# Patient Record
Sex: Female | Born: 1975 | Race: White | Hispanic: No | Marital: Married | Smoking: Current every day smoker
Health system: Southern US, Community
[De-identification: ages and names within clinical notes are randomized; demographics above are authoritative.]

## PROBLEM LIST (undated history)

## (undated) DIAGNOSIS — I10 Essential (primary) hypertension: Secondary | ICD-10-CM

## (undated) DIAGNOSIS — K219 Gastro-esophageal reflux disease without esophagitis: Secondary | ICD-10-CM

## (undated) DIAGNOSIS — Z720 Tobacco use: Secondary | ICD-10-CM

## (undated) DIAGNOSIS — F419 Anxiety disorder, unspecified: Secondary | ICD-10-CM

## (undated) DIAGNOSIS — N200 Calculus of kidney: Secondary | ICD-10-CM

## (undated) DIAGNOSIS — Z5320 Procedure and treatment not carried out because of patient's decision for unspecified reasons: Secondary | ICD-10-CM

## (undated) DIAGNOSIS — K760 Fatty (change of) liver, not elsewhere classified: Secondary | ICD-10-CM

## (undated) DIAGNOSIS — G43909 Migraine, unspecified, not intractable, without status migrainosus: Secondary | ICD-10-CM

## (undated) DIAGNOSIS — G8929 Other chronic pain: Secondary | ICD-10-CM

## (undated) HISTORY — DX: Fatty (change of) liver, not elsewhere classified: K76.0

## (undated) HISTORY — DX: Gastro-esophageal reflux disease without esophagitis: K21.9

## (undated) HISTORY — DX: Anxiety disorder, unspecified: F41.9

## (undated) HISTORY — DX: Essential (primary) hypertension: I10

## (undated) HISTORY — PX: ABLATION: SHX5711

## (undated) HISTORY — DX: Migraine, unspecified, not intractable, without status migrainosus: G43.909

## (undated) HISTORY — DX: Tobacco use: Z72.0

## (undated) HISTORY — DX: Procedure and treatment not carried out because of patient's decision for unspecified reasons: Z53.20

## (undated) HISTORY — DX: Other chronic pain: G89.29

## (undated) HISTORY — PX: TUBAL LIGATION: SHX77

## (undated) HISTORY — PX: CHOLECYSTECTOMY: SHX55

---

## 2010-09-23 ENCOUNTER — Emergency Department (HOSPITAL_COMMUNITY): Payer: PRIVATE HEALTH INSURANCE

## 2010-09-23 ENCOUNTER — Emergency Department (HOSPITAL_COMMUNITY)
Admission: EM | Admit: 2010-09-23 | Discharge: 2010-09-23 | Disposition: A | Discharge status: Home / Self Care | Payer: PRIVATE HEALTH INSURANCE | Attending: Emergency Medicine | Admitting: Emergency Medicine

## 2010-09-23 DIAGNOSIS — M533 Sacrococcygeal disorders, not elsewhere classified: Secondary | ICD-10-CM | POA: Insufficient documentation

## 2010-09-23 DIAGNOSIS — S0990XA Unspecified injury of head, initial encounter: Secondary | ICD-10-CM | POA: Insufficient documentation

## 2010-09-23 DIAGNOSIS — S59909A Unspecified injury of unspecified elbow, initial encounter: Secondary | ICD-10-CM | POA: Insufficient documentation

## 2010-09-23 DIAGNOSIS — S6990XA Unspecified injury of unspecified wrist, hand and finger(s), initial encounter: Secondary | ICD-10-CM | POA: Insufficient documentation

## 2010-09-23 DIAGNOSIS — M25579 Pain in unspecified ankle and joints of unspecified foot: Secondary | ICD-10-CM | POA: Insufficient documentation

## 2010-09-23 DIAGNOSIS — M25559 Pain in unspecified hip: Secondary | ICD-10-CM | POA: Insufficient documentation

## 2010-09-23 DIAGNOSIS — M542 Cervicalgia: Secondary | ICD-10-CM | POA: Insufficient documentation

## 2010-09-23 DIAGNOSIS — R079 Chest pain, unspecified: Secondary | ICD-10-CM | POA: Insufficient documentation

## 2010-09-23 DIAGNOSIS — S8990XA Unspecified injury of unspecified lower leg, initial encounter: Secondary | ICD-10-CM | POA: Insufficient documentation

## 2010-09-23 DIAGNOSIS — R51 Headache: Secondary | ICD-10-CM | POA: Insufficient documentation

## 2010-09-23 DIAGNOSIS — M25539 Pain in unspecified wrist: Secondary | ICD-10-CM | POA: Insufficient documentation

## 2010-09-23 DIAGNOSIS — Z79899 Other long term (current) drug therapy: Secondary | ICD-10-CM | POA: Insufficient documentation

## 2013-03-01 ENCOUNTER — Ambulatory Visit: Payer: Self-pay | Admitting: Physician Assistant

## 2013-03-01 VITALS — BP 152/120 | HR 104 | Temp 98.5°F | Resp 16 | Ht 65.5 in | Wt 167.2 lb

## 2013-03-01 DIAGNOSIS — F17200 Nicotine dependence, unspecified, uncomplicated: Secondary | ICD-10-CM

## 2013-03-01 DIAGNOSIS — I10 Essential (primary) hypertension: Secondary | ICD-10-CM

## 2013-03-01 LAB — POCT CBC
Granulocyte percent: 58.3 %G (ref 37–80)
HCT, POC: 49.8 % — AB (ref 37.7–47.9)
Hemoglobin: 15.6 g/dL (ref 12.2–16.2)
LYMPH, POC: 3.7 — AB (ref 0.6–3.4)
MCH: 31.2 pg (ref 27–31.2)
MCHC: 31.3 g/dL — AB (ref 31.8–35.4)
MCV: 99.7 fL — AB (ref 80–97)
MID (CBC): 0.5 (ref 0–0.9)
MPV: 10.8 fL (ref 0–99.8)
PLATELET COUNT, POC: 320 10*3/uL (ref 142–424)
POC Granulocyte: 6 (ref 2–6.9)
POC LYMPH %: 36.4 % (ref 10–50)
POC MID %: 5.3 % (ref 0–12)
RBC: 5 M/uL (ref 4.04–5.48)
RDW, POC: 13.3 %
WBC: 10.3 10*3/uL — AB (ref 4.6–10.2)

## 2013-03-01 LAB — COMPREHENSIVE METABOLIC PANEL
ALT: 26 U/L (ref 0–35)
AST: 24 U/L (ref 0–37)
Albumin: 4.9 g/dL (ref 3.5–5.2)
Alkaline Phosphatase: 75 U/L (ref 39–117)
BILIRUBIN TOTAL: 0.4 mg/dL (ref 0.2–1.2)
BUN: 14 mg/dL (ref 6–23)
CALCIUM: 9.9 mg/dL (ref 8.4–10.5)
CHLORIDE: 103 meq/L (ref 96–112)
CO2: 24 mEq/L (ref 19–32)
CREATININE: 0.62 mg/dL (ref 0.50–1.10)
Glucose, Bld: 80 mg/dL (ref 70–99)
Potassium: 4.1 mEq/L (ref 3.5–5.3)
Sodium: 135 mEq/L (ref 135–145)
Total Protein: 7.6 g/dL (ref 6.0–8.3)

## 2013-03-01 LAB — TSH: TSH: 3.686 u[IU]/mL (ref 0.350–4.500)

## 2013-03-01 MED ORDER — LISINOPRIL-HYDROCHLOROTHIAZIDE 10-12.5 MG PO TABS: 1.0000 | ORAL_TABLET | Freq: Every day | ORAL | Status: DC

## 2013-03-01 NOTE — Progress Notes (Signed)
Subjective:    Patient ID: Dominique Stevens, female    DOB: 10/12/1975, 38 y.o.   MRN: [id number]  HPI Primary Physician: No primary provider on file.  Chief Complaint: Elevated blood pressure  HPI: 38 y.o. female with history of anxiety presents for evaluation of elevated blood pressure. Patient planning to have oral surgery by Dr. Alfred Levins at a future date to be determined. Her BP was 204/174 at her pre-op OV on 02/28/13. She checked her BP at [facility] later that same day as well and the reading was still in the 180's/120's. She has never been diagnosed with hypertension nor has she ever taken any antihypertensives. She does note some increases in her headaches, but denies any chest pain or vision changes.   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 She has two teenage boys. She is constantly on the move taking care of her husband and two sons. She has let her health slide secondary to taking care of the above. She does not take time out for herself. She eats mostly fried or fast foods. No healthy foods. She does not exercise. Current everyday smoker.   She does have a history of anxiety and when she starts to feel some panic her chest will tighten. She denies any chest pain.    Past Medical History  Diagnosis Date  . Anxiety      Home Meds: Prior to Admission medications   Medication Sig Start Date End Date Taking? Authorizing Provider  HYDROcodone-acetaminophen (NORCO/VICODIN) 5-325 MG per tablet Take 1 tablet by mouth every 6 (six) hours as needed for moderate pain.   Yes Historical Provider, MD  penicillin v potassium (VEETID) 500 MG tablet Take 500 mg by mouth 3 (three) times daily.   Yes Historical Provider, MD    Allergies: No Known Allergies  History   Social History  . Marital Status: Married    Spouse Name: N/A    Number of Children: N/A  . Years of Education: N/A   Occupational History  . Not on file.   Social  History Main Topics  . Smoking status: Current Every Day Smoker -- 0.05 packs/day for 20 years    Types: Cigarettes  . Smokeless tobacco: Not on file  . Alcohol Use: No  . Drug Use: No  . Sexual Activity: Not on file   Other Topics Concern  . Not on file   Social History Narrative  . No narrative on file     Review of Systems  Constitutional: Positive for unexpected weight change.       Gain of 20 pounds over the past several years.   Eyes: Negative for visual disturbance.  Respiratory: Positive for chest tightness.   Cardiovascular: Negative for chest pain.  Gastrointestinal: Negative for diarrhea.  Endocrine: Positive for heat intolerance.  Neurological: Positive for headaches.  Psychiatric/Behavioral: Negative for suicidal ideas, hallucinations, behavioral problems, confusion, sleep disturbance, self-injury, dysphoric mood, decreased concentration and agitation. The patient is nervous/anxious. The patient is not hyperactive.        Objective:   Physical Exam  Physical Exam: Blood pressure 152/120, pulse 104, temperature 98.5 F (36.9 C), temperature source Oral, resp. rate 16, height 5' 5.5" (1.664 m), weight 167 lb 3.2 oz (75.841 kg), SpO2 98.00%., Body mass index is 27.39 kg/(m^2). General: Well developed, well nourished, in no acute distress. Head: Normocephalic, atraumatic, eyes without discharge, sclera non-icteric, nares are without discharge. Bilateral auditory  canals clear, TM's are without perforation, pearly grey and translucent with reflective cone of light bilaterally. Oral cavity moist, posterior pharynx without exudate, erythema, peritonsillar abscess, or post nasal drip. Uvula midline.   Neck: Supple. No thyromegaly. Full ROM. No lymphadenopathy. Lungs: Clear bilaterally to auscultation without wheezes, rales, or rhonchi. Breathing is unlabored. Heart: RRR with S1 S2. No murmurs, rubs, or gallops appreciated. Msk:  Strength and tone normal for  age. Extremities/Skin: Warm and dry. No clubbing or cyanosis. No edema. No rashes or suspicious lesions. Neuro: Alert and oriented X 3. Moves all extremities spontaneously. Gait is normal. CNII-XII grossly in tact. Psych:  Responds to questions appropriately with a normal affect.   EKG: NSR, no LVH  Labs: Results for orders placed in visit on 03/01/13  POCT CBC      Result Value Ref Range   WBC 10.3 (*) 4.6 - 10.2 K/uL   Lymph, poc 3.7 (*) 0.6 - 3.4   POC LYMPH PERCENT 36.4  10 - 50 %L   MID (cbc) 0.5  0 - 0.9   POC MID % 5.3  0 - 12 %M   POC Granulocyte 6.0  2 - 6.9   Granulocyte percent 58.3  37 - 80 %G   RBC 5.00  4.04 - 5.48 M/uL   Hemoglobin 15.6  12.2 - 16.2 g/dL   HCT, POC 49.8 (*) 37.7 - 47.9 %   MCV 99.7 (*) 80 - 97 fL   MCH, POC 31.2  27 - 31.2 pg   MCHC 31.3 (*) 31.8 - 35.4 g/dL   RDW, POC 13.3     Platelet Count, POC 320  142 - 424 K/uL   MPV 10.8  0 - 99.8 fL    CMP and TSH pending     Assessment & Plan:  38 year old female with uncontrolled hypertension and tobacco use disorder  -Start lisinopril/HCTZ 10/12.5 mg 1 po daily #90 RF 0 -Healthy diet and exercise  -Weight loss -Stop smoking tobacco -Take time out for yourself -Recheck 2-3 weeks -Fax clearance to Dr. Alfred Levins at [phone] once she has demonstrated control    Christell Faith, MHS, PA-C [facility] [place], [place] [address] [facility] 03/01/2013 11:32 AM

## 2013-03-12 ENCOUNTER — Telehealth: Payer: Self-pay

## 2013-03-12 ENCOUNTER — Ambulatory Visit: Payer: Self-pay | Admitting: Physician Assistant

## 2013-03-12 VITALS — BP 132/82 | HR 102 | Temp 98.2°F | Resp 17 | Ht 65.5 in | Wt 158.0 lb

## 2013-03-12 DIAGNOSIS — R112 Nausea with vomiting, unspecified: Secondary | ICD-10-CM

## 2013-03-12 DIAGNOSIS — K0889 Other specified disorders of teeth and supporting structures: Secondary | ICD-10-CM

## 2013-03-12 DIAGNOSIS — K089 Disorder of teeth and supporting structures, unspecified: Secondary | ICD-10-CM

## 2013-03-12 DIAGNOSIS — I10 Essential (primary) hypertension: Secondary | ICD-10-CM

## 2013-03-12 MED ORDER — PROMETHAZINE HCL 12.5 MG PO TABS: 12.5000 mg | ORAL_TABLET | Freq: Three times a day (TID) | ORAL | Status: DC | PRN

## 2013-03-12 MED ORDER — LISINOPRIL-HYDROCHLOROTHIAZIDE 10-12.5 MG PO TABS: 1.0000 | ORAL_TABLET | Freq: Every day | ORAL | Status: DC

## 2013-03-12 MED ORDER — PROMETHAZINE HCL 25 MG/ML IJ SOLN
25.0000 mg | Freq: Once | INTRAMUSCULAR | Status: AC
  Administered 2013-03-12: 25 mg via INTRAMUSCULAR

## 2013-03-12 NOTE — Telephone Encounter (Signed)
PT STATES THAT SHE WILL BE COMING IN TODAY!

## 2013-03-12 NOTE — Telephone Encounter (Signed)
Patient called to ask if someone clinical or a nurse could call her back today (I told her we would try at least to respond back within 24 hours). Patient see's Thurmond Butts and was last seen for her blood pressure and the medication she was given by him for that appeared to be working fine. She now reports vomiting and illness; she states her blood pressure was normal before at 116/82 now it has suddenly shot to 160/98 for 3 or 4 days (she says she has an at home machine to read her blood pressure). Patient is alarmed and wants to know if its because of the medication or something else. Please advise. She was advised that she might need to be seen for these new symptoms. She still requested to speak to someone clinical and she understood if she may need to come back in. She is aware of Ryan's hours.   [phone]

## 2013-03-12 NOTE — Progress Notes (Signed)
Subjective:    Patient ID: Dominique Stevens, female    DOB: [birth year], 38 y.o.   MRN: [id number]  HPI Primary Physician: No PCP Per Patient  Chief Complaint: Follow up hypertension and nausea/vomiting  HPI: 38 y.o. female with history below presents with 2 issues.  1) Hypertension: Currently on lisinopril/HCTZ 10/12.5 mg daily. Notes that her blood pressures at home are much improved from initial presentation of 152/120. Home blood pressures have been running in the 1-teens to 120's over 70's to 80's. She is tolerating the medication without any adverse effects. No chest pain, headaches, vision changes, or focal deficits. She would like to go ahead and schedule her dental procedure.   2) Nausea/vomiting: 2 day history of nausea and vomiting. She was recently changed from penicillin to amoxicillin and her hydrocodone was increased from 5 mg to 10 mg. Since these changes when she takes her medications she develops nausea and sometimes vomits. After she vomits she feels better. Outside of taking her medications she feels fine. She states it is completely related to taking these medications. She knows that she does not tolerate pain medication in general. No diarrhea. Afebrile. No chills. Decreased appetite, but more so because her teeth hurt to chew food right now. She would like to go ahead and have her dental procedure done this week.     Past Medical History  Diagnosis Date  . Anxiety   . Hypertension      Home Meds: Prior to Admission medications   Medication Sig Start Date End Date Taking? Authorizing Provider  amoxicillin (AMOXIL) 500 MG capsule Take 500 mg by mouth 3 (three) times daily.   Yes Historical Provider, MD  HYDROcodone-acetaminophen (NORCO/VICODIN) 5-325 MG per tablet Take 1 tablet by mouth every 6 (six) hours as needed for moderate pain.   Yes Historical Provider, MD  lisinopril-hydrochlorothiazide (PRINZIDE,ZESTORETIC) 10-12.5 MG per tablet Take 1 tablet by mouth daily.  03/01/13  Yes Redell Nazir Lyn Hollingshead, PA-C    Allergies: No Known Allergies  History   Social History  . Marital Status: Married    Spouse Name: N/A    Number of Children: N/A  . Years of Education: N/A   Occupational History  . Not on file.   Social History Main Topics  . Smoking status: Current Every Day Smoker -- 0.50 packs/day for 20 years    Types: Cigarettes  . Smokeless tobacco: Not on file  . Alcohol Use: No  . Drug Use: No  . Sexual Activity: Not on file   Other Topics Concern  . Not on file   Social History Narrative  . No narrative on file     Review of Systems  Constitutional: Positive for appetite change. Negative for fever, chills and fatigue.       Pushing fluids.   HENT: Positive for dental problem.   Eyes: Negative for visual disturbance.  Cardiovascular: Negative for chest pain.  Gastrointestinal: Positive for nausea, vomiting and constipation. Negative for abdominal pain, diarrhea and abdominal distention.  Neurological: Negative for headaches.       Objective:   Physical Exam  Physical Exam: Blood pressure 132/82, pulse 102, temperature 98.2 F (36.8 C), temperature source Oral, resp. rate 17, height 5' 5.5" (1.664 m), weight 158 lb (71.668 kg), SpO2 96.00%., Body mass index is 25.88 kg/(m^2). General: Well developed, well nourished, in no acute distress. Head: Normocephalic, atraumatic, eyes without discharge, sclera non-icteric, nares are without discharge. Bilateral auditory canals clear, TM's are without perforation, pearly grey  and translucent with reflective cone of light bilaterally. Oral cavity moist, posterior pharynx without exudate, erythema, peritonsillar abscess, or post nasal drip. Uvula midline.   Neck: Supple. No thyromegaly. Full ROM. No lymphadenopathy. Lungs: Clear bilaterally to auscultation without wheezes, rales, or rhonchi. Breathing is unlabored. Heart: RRR with S1 S2. No murmurs, rubs, or gallops appreciated. Abdomen: Soft,  non-tender, non-distended with normoactive bowel sounds. No hepatosplenomegaly. No rebound/guarding. No obvious abdominal masses. Msk:  Strength and tone normal for age. Extremities/Skin: Warm and dry. No clubbing or cyanosis. No edema. No rashes or suspicious lesions. Neuro: Alert and oriented X 3. Moves all extremities spontaneously. Gait is normal. CNII-XII grossly in tact. Psych:  Responds to questions appropriately with a normal affect.        Assessment & Plan:  38 year old female with hypertension, nausea, vomiting, and poor dentition/dental pain  1) Hypertension -Much improved -Continue lisinopril/HCTZ 10/12.5 mg daily #90 no RF  2) Nausea and vomiting -Based on her history this is completely dependent on her medications for her teeth. It did nto start until she was changed to amoxicillin and when her hydrocodone was increased from 5 mg to 10 mg. She does not develop nausea until she takes these medications. Based on this history and with trying to be financially conscious for her I have decided to with hold labs at this time. If her symptoms persist plan for further evaluation.  -Phenergan 25 mg IM, patient's mother is here in the exam room. Here mother will be driving home -Phenergan 12.5 mg 1 po q 8 hours prn nausea #20 RF 2 -She to tolerating PO fluids. If she is unable to rehydrate through oral rehydration she will require IV rehydration -Discussed common OTC remedies for constipation secondary to long term narcotic usage   3) Poor dentition/dental pain -Cleared for dental work at this time -Follow up with Dr. Alfred Levins   Christell Faith, MHS, PA-C [facility] [address] [place], [place] [address] [facility] 03/12/2013 1:00 PM

## 2013-03-20 ENCOUNTER — Ambulatory Visit: Payer: Self-pay | Admitting: Physician Assistant

## 2013-08-23 ENCOUNTER — Telehealth: Payer: Self-pay

## 2013-08-23 DIAGNOSIS — I10 Essential (primary) hypertension: Secondary | ICD-10-CM

## 2013-08-23 MED ORDER — LISINOPRIL-HYDROCHLOROTHIAZIDE 10-12.5 MG PO TABS: 1.0000 | ORAL_TABLET | Freq: Every day | ORAL | Status: DC

## 2013-08-23 NOTE — Telephone Encounter (Signed)
Patient requesting refill on "lisinopril 10-12.5mg " please call in to [facility] in [place]. Patients call back number [phone]. Patient states she only has 3 pills left.

## 2013-08-23 NOTE — Addendum Note (Signed)
Addended by: Jethro Bolus A on: 08/23/2013 04:25 PM   Modules accepted: Orders

## 2013-08-23 NOTE — Telephone Encounter (Signed)
Pt needs OV/Labs. Sent in 30 day supply. She does not have health insurance right now. She has recently started a new job and has to wait 90 days for her insurance to take effect. She also has a form that needs to be completed. She states this is to keep her new job. It is a 4 question form in regards to medication she is taking preventing her from taking care of children. Advised pt to email form to me and I would see if one of the PA's would be able to sign it.

## 2013-08-25 NOTE — Telephone Encounter (Signed)
Pt advised form has been completed.  In pick up drawer and copy sent to scan.

## 2013-08-25 NOTE — Telephone Encounter (Signed)
Form completed based on Mr. Dominique Stevens encounter of 03/01/2013.

## 2013-10-04 ENCOUNTER — Telehealth: Payer: Self-pay

## 2013-10-04 DIAGNOSIS — I10 Essential (primary) hypertension: Secondary | ICD-10-CM

## 2013-10-04 MED ORDER — LISINOPRIL-HYDROCHLOROTHIAZIDE 10-12.5 MG PO TABS: 1.0000 | ORAL_TABLET | Freq: Every day | ORAL | Status: DC

## 2013-10-04 NOTE — Telephone Encounter (Signed)
Pt is still waiting for insurance to take effect. Pt will be able to come in sometime in November.  Sent in another #30

## 2013-10-04 NOTE — Telephone Encounter (Signed)
Pt says she has contacted her pharmacy for three days now and that they have tried to contact us to request a refill on her BP medicine.  I did not see anything in the computer for this.  She has one pill left.  Please call asap. [phone]

## 2017-10-17 ENCOUNTER — Emergency Department (HOSPITAL_COMMUNITY): Payer: Self-pay

## 2017-10-17 ENCOUNTER — Other Ambulatory Visit: Payer: Self-pay

## 2017-10-17 ENCOUNTER — Emergency Department (HOSPITAL_COMMUNITY)
Admission: EM | Admit: 2017-10-17 | Discharge: 2017-10-17 | Disposition: A | Discharge status: Home / Self Care | Payer: Self-pay | Attending: Emergency Medicine | Admitting: Emergency Medicine

## 2017-10-17 ENCOUNTER — Encounter (HOSPITAL_COMMUNITY): Payer: Self-pay | Admitting: Emergency Medicine

## 2017-10-17 DIAGNOSIS — Z590 Homelessness: Secondary | ICD-10-CM | POA: Insufficient documentation

## 2017-10-17 DIAGNOSIS — Z79899 Other long term (current) drug therapy: Secondary | ICD-10-CM | POA: Insufficient documentation

## 2017-10-17 DIAGNOSIS — I10 Essential (primary) hypertension: Secondary | ICD-10-CM | POA: Insufficient documentation

## 2017-10-17 DIAGNOSIS — R51 Headache: Secondary | ICD-10-CM | POA: Insufficient documentation

## 2017-10-17 DIAGNOSIS — R519 Headache, unspecified: Secondary | ICD-10-CM

## 2017-10-17 DIAGNOSIS — F17210 Nicotine dependence, cigarettes, uncomplicated: Secondary | ICD-10-CM | POA: Insufficient documentation

## 2017-10-17 LAB — CBC
HCT: 45.3 % (ref 36.0–46.0)
Hemoglobin: 14.3 g/dL (ref 12.0–15.0)
MCH: 29.7 pg (ref 26.0–34.0)
MCHC: 31.6 g/dL (ref 30.0–36.0)
MCV: 94.2 fL (ref 80.0–100.0)
PLATELETS: 444 10*3/uL — AB (ref 150–400)
RBC: 4.81 MIL/uL (ref 3.87–5.11)
RDW: 13.2 % (ref 11.5–15.5)
WBC: 11.6 10*3/uL — ABNORMAL HIGH (ref 4.0–10.5)
nRBC: 0 % (ref 0.0–0.2)

## 2017-10-17 LAB — URINALYSIS, ROUTINE W REFLEX MICROSCOPIC
Bilirubin Urine: NEGATIVE
GLUCOSE, UA: NEGATIVE mg/dL
Hgb urine dipstick: NEGATIVE
Ketones, ur: 20 mg/dL — AB
Leukocytes, UA: NEGATIVE
Nitrite: NEGATIVE
PH: 8 (ref 5.0–8.0)
Protein, ur: 30 mg/dL — AB
SPECIFIC GRAVITY, URINE: 1.023 (ref 1.005–1.030)

## 2017-10-17 LAB — COMPREHENSIVE METABOLIC PANEL
ALBUMIN: 4.5 g/dL (ref 3.5–5.0)
ALK PHOS: 87 U/L (ref 38–126)
ALT: 49 U/L — ABNORMAL HIGH (ref 0–44)
ANION GAP: 8 (ref 5–15)
AST: 43 U/L — ABNORMAL HIGH (ref 15–41)
BILIRUBIN TOTAL: 0.9 mg/dL (ref 0.3–1.2)
BUN: 11 mg/dL (ref 6–20)
CALCIUM: 9.7 mg/dL (ref 8.9–10.3)
CO2: 27 mmol/L (ref 22–32)
CREATININE: 0.69 mg/dL (ref 0.44–1.00)
Chloride: 102 mmol/L (ref 98–111)
GFR calc Af Amer: >60 mL/min (ref >60)
GFR calc non Af Amer: >60 mL/min (ref >60)
GLUCOSE: 101 mg/dL — AB (ref 70–99)
Potassium: 3.9 mmol/L (ref 3.5–5.1)
Sodium: 137 mmol/L (ref 135–145)
TOTAL PROTEIN: 7.7 g/dL (ref 6.5–8.1)

## 2017-10-17 LAB — LIPASE, BLOOD: Lipase: 26 U/L (ref 11–51)

## 2017-10-17 MED ORDER — SODIUM CHLORIDE 0.9 % IV SOLN
1000.0000 mL | INTRAVENOUS | Status: DC
  Administered 2017-10-17: 1000 mL via INTRAVENOUS

## 2017-10-17 MED ORDER — MORPHINE SULFATE (PF) 4 MG/ML IV SOLN
4.0000 mg | Freq: Once | INTRAVENOUS | Status: AC
  Administered 2017-10-17: 4 mg via INTRAVENOUS
  Filled 2017-10-17: qty 1

## 2017-10-17 MED ORDER — SODIUM CHLORIDE 0.9 % IV BOLUS (SEPSIS)
1000.0000 mL | Freq: Once | INTRAVENOUS | Status: AC
  Administered 2017-10-17: 1000 mL via INTRAVENOUS

## 2017-10-17 MED ORDER — KETOROLAC TROMETHAMINE 30 MG/ML IJ SOLN
30.0000 mg | Freq: Once | INTRAMUSCULAR | Status: AC
  Administered 2017-10-17: 30 mg via INTRAVENOUS
  Filled 2017-10-17: qty 1

## 2017-10-17 MED ORDER — DIPHENHYDRAMINE HCL 50 MG/ML IJ SOLN
25.0000 mg | Freq: Once | INTRAMUSCULAR | Status: AC
  Administered 2017-10-17: 25 mg via INTRAVENOUS
  Filled 2017-10-17: qty 1

## 2017-10-17 MED ORDER — METOCLOPRAMIDE HCL 5 MG/ML IJ SOLN
10.0000 mg | Freq: Once | INTRAMUSCULAR | Status: AC
  Administered 2017-10-17: 10 mg via INTRAVENOUS
  Filled 2017-10-17: qty 2

## 2017-10-17 MED ORDER — ONDANSETRON HCL 4 MG/2ML IJ SOLN
4.0000 mg | Freq: Once | INTRAMUSCULAR | Status: AC
  Administered 2017-10-17: 4 mg via INTRAVENOUS
  Filled 2017-10-17: qty 2

## 2017-10-17 MED ORDER — PROCHLORPERAZINE MALEATE 10 MG PO TABS: 10.0000 mg | ORAL_TABLET | Freq: Two times a day (BID) | ORAL | 0 refills | Status: DC | PRN

## 2017-10-17 NOTE — Discharge Instructions (Signed)
Your work-up today was overall reassuring.  We did see evidence of chronic microvascular changes likely due to hypertension on your MRI.  Please follow-up with a primary care physician for further monitoring of this.  Please maintain your hydration.  Please use the nausea medication to help with nausea and headache and to drink fluids.  If any symptoms change or worsen, please return to the nearest emergency department.

## 2017-10-17 NOTE — ED Notes (Signed)
Pt returned from MRI °

## 2017-10-17 NOTE — ED Triage Notes (Signed)
Pt. Stated, I started having a headache and not being able to keep anything down afer supper, I also having some chest pain. And have los 20 lbs.

## 2017-10-17 NOTE — ED Notes (Signed)
Per MRI, pt has 1 patient in front of her. Pt notified.

## 2017-10-17 NOTE — ED Provider Notes (Addendum)
[facility] EMERGENCY DEPARTMENT Provider Note   CSN: [id number] Arrival date & time: 10/17/17  1134     History   Chief Complaint Chief Complaint  Patient presents with  . Chest Pain  . Headache  . Nausea    HPI Dominique Stevens is a 42 y.o. female.  HPI Patient reports that she started having a headache yesterday.  It started on the right side behind her eye and forehead.  She reports it was pretty bad but she continued her normal activities.  She reports after dinner she started vomiting and she has not stop vomiting since.  Reports that she still has a lot of pain on the right side of her forehead.  She denies she has history of migraines.  No visual changes.  No weakness numbness or tingling of extremities.  No gait incoordination.  No dizziness.  No fever, no neck stiffness.  She reports that she started getting chest pressure today but she thinks is because she is getting very anxious.  Patient reports that she does have a lot of life issues that contribute to anxiety.  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  She quit her job within the past month. Past Medical History:  Diagnosis Date  . Anxiety   . Hypertension     There are no active problems to display for this patient.   Past Surgical History:  Procedure Laterality Date  . ABLATION    . CHOLECYSTECTOMY    . TUBAL LIGATION       OB History   None      Home Medications    Prior to Admission medications   Medication Sig Start Date End Date Taking? Authorizing Provider  omeprazole (PRILOSEC) 40 MG capsule Take 40 mg by mouth daily.   Yes [provider]  lisinopril-hydrochlorothiazide (PRINZIDE,ZESTORETIC) 10-12.5 MG per tablet Take 1 tablet by mouth daily. Patient not taking: Reported on 10/17/2017 10/04/13   Harrison Mons, PA  promethazine (PHENERGAN) 12.5 MG tablet Take 1 tablet (12.5 mg total) by mouth every 8 (eight) hours as needed for nausea. Patient not taking: Reported on  10/17/2017 03/12/13   Rise Mu, PA-C    Family History Family History  Problem Relation Age of Onset  . Hypertension Mother   . Hypertension Maternal Grandmother   . Heart disease Maternal Grandfather     Social History Social History   Tobacco Use  . Smoking status: Current Every Day Smoker    Packs/day: 0.50    Years: 20.00    Pack years: 10.00    Types: Cigarettes  . Smokeless tobacco: Current User  Substance Use Topics  . Alcohol use: Yes  . Drug use: No     Allergies   Patient has no known allergies.   Review of Systems Review of Systems 10 Systems reviewed and are negative for acute change except as noted in the HPI.  Physical Exam Updated Vital Signs BP 133/83   Pulse 78   Resp 10   Ht 5\' 6"  (1.676 m)   SpO2 99%   BMI 25.50 kg/m   Physical Exam  Constitutional: She is oriented to person, place, and time.  Patient is alert and nontoxic.  Mental status is clear.  She is very anxious in appearance.  She is tearful and hyperventilating slightly.  HENT:  Head: Normocephalic and atraumatic.  Nose: Nose normal.  Mouth/Throat: Oropharynx is clear and moist.  Eyes: Pupils are equal, round, and reactive to light. EOM  are normal.  Neck: Neck supple.  Cardiovascular: Normal rate, regular rhythm, normal heart sounds and intact distal pulses.  Pulmonary/Chest: Effort normal and breath sounds normal.  Abdominal: Soft. She exhibits no distension. There is no tenderness. There is no guarding.  Musculoskeletal: Normal range of motion. She exhibits no edema or tenderness.  Neurological: She is alert and oriented to person, place, and time. No cranial nerve deficit. She exhibits normal muscle tone. Coordination normal.  Skin: Skin is warm and dry.  Psychiatric:  Patient is very anxious and intermittently slightly tearful.     ED Treatments / Results  Labs (all labs ordered are listed, but only abnormal results are displayed) Labs Reviewed  COMPREHENSIVE  METABOLIC PANEL - Abnormal; Notable for the following components:      Result Value   Glucose, Bld 101 (*)    AST 43 (*)    ALT 49 (*)    All other components within normal limits  CBC - Abnormal; Notable for the following components:   WBC 11.6 (*)    Platelets 444 (*)    All other components within normal limits  URINALYSIS, ROUTINE W REFLEX MICROSCOPIC - Abnormal; Notable for the following components:   APPearance HAZY (*)    Ketones, ur 20 (*)    Protein, ur 30 (*)    Bacteria, UA RARE (*)    All other components within normal limits  LIPASE, BLOOD    EKG EKG Interpretation  Date/Time:  Sunday October 17 2017 11:39:36 EDT Ventricular Rate:  102 PR Interval:  126 QRS Duration: 72 QT Interval:  340 QTC Calculation: 443 R Axis:   78 Text Interpretation:  Sinus tachycardia Biatrial enlargement Cannot rule out Anterior infarct , age undetermined Abnormal ECG agree. no old comparison Confirmed by Charlesetta Shanks [phone]) on 10/17/2017 1:09:35 PM   Radiology Dg Chest 2 View  Result Date: 10/17/2017 CLINICAL DATA:  Chest pain EXAM: CHEST - 2 VIEW COMPARISON:  01/21/2015 FINDINGS: The heart size and mediastinal contours are within normal limits. Both lungs are clear. The visualized skeletal structures are unremarkable. IMPRESSION: No active cardiopulmonary disease. Electronically Signed   By: Inez Catalina M.D.   On: 10/17/2017 13:19   Ct Head Wo Contrast  Result Date: 10/17/2017 CLINICAL DATA:  Patient with severe headache. EXAM: CT HEAD WITHOUT CONTRAST TECHNIQUE: Contiguous axial images were obtained from the base of the skull through the vertex without intravenous contrast. COMPARISON:  None. FINDINGS: Brain: Ventricles and sulci are appropriate for patient's age. No evidence for acute cortically based infarct, intracranial hemorrhage, mass lesion or mass-effect. Vascular: Unremarkable. Skull: Intact. Sinuses/Orbits: Air-fluid level within the left maxillary sinus. Remainder the  paranasal sinuses well aerated. Mastoid air cells unremarkable. Orbits unremarkable. Other: None. IMPRESSION: No acute intracranial process. Electronically Signed   By: Lovey Newcomer M.D.   On: 10/17/2017 14:26    Procedures Procedures (including critical care time)  Medications Ordered in ED Medications  sodium chloride 0.9 % bolus 1,000 mL (0 mLs Intravenous Stopped 10/17/17 1358)    Followed by  0.9 %  sodium chloride infusion (1,000 mLs Intravenous New Bag/Given 10/17/17 1330)  ondansetron (ZOFRAN) injection 4 mg (4 mg Intravenous Given 10/17/17 1331)  morphine 4 MG/ML injection 4 mg (4 mg Intravenous Given 10/17/17 1331)  metoCLOPramide (REGLAN) injection 10 mg (10 mg Intravenous Given 10/17/17 1458)  diphenhydrAMINE (BENADRYL) injection 25 mg (25 mg Intravenous Given 10/17/17 1457)  ketorolac (TORADOL) 30 MG/ML injection 30 mg (30 mg Intravenous Given 10/17/17 1458)  Initial Impression / Assessment and Plan / ED Course  I have reviewed the triage vital signs and the nursing notes.  Pertinent labs & imaging results that were available during my care of the patient were reviewed by me and considered in my medical decision making (see chart for details).  Clinical Course as of Oct 18 1534  Sun Oct 17, 2017  1413 Patient reports improved after medications.  She reports she feels much more relaxed now she reports she still has some headache but feels much more calm.  Patient is alert and in no acute distress.  Mental status clear.   [MP]  6579 Patient reports she has improved but still has headache pain behind her right eye and forehead.  Patient is alert and appropriate.  Mental status is clear.  No neurologic deficits.   [MP]    Clinical Course User Index [MP] Charlesetta Shanks, MD  Patient is alert and appropriate.  She does not have focal neurologic deficit.  She did however have onset of severe headache.  She reports this is atypical for her.  No history of migraines.  No signs  of infectious etiology.  CT does not show acute findings.  At this time we will proceed with MRI MRA to rule out aneurysm or dissection.  If studies are negative, patient is stable for discharge with outpatient follow-up.  Dr. Sherry Ruffing will follow up on MRI results.   Final Clinical Impressions(s) / ED Diagnoses   Final diagnoses:  Sudden onset of severe headache    ED Discharge Orders    None       Charlesetta Shanks, MD 10/17/17 1433    Charlesetta Shanks, MD 10/17/17 1434    Charlesetta Shanks, MD 10/17/17 1536

## 2017-10-17 NOTE — ED Provider Notes (Signed)
3:35 PM Care assumed from Dr. Vallery Ridge.  At time of transfer care, patient is awaiting results of MRIs..  Plan of care is to discharge patient if MRIs are reassuring and patient is having improved symptoms.  9:37 PM Diagnostic MRIs showed no acute ab normalities.  There was evidence of microvascular changes likely related to hypertension.  Patient reports that several years ago she had elevated blood pressures that have been better after weight loss.  Patient reports her headache is improved after medications.  Given her lack of new symptoms and her reassuring work-up patient was felt stable for discharge home.  She will follow-up with a PCP.  She requested a medication for nausea so she maintain hydration as there was evidence of dehydration.  She will be given Compazine.  Patient will follow-up and understood return precautions.  Patient discharged in good condition with improved symptoms.  Clinical Impression: 1. Sudden onset of severe headache     Disposition: Discharge  Condition: Good  I have discussed the results, Dx and Tx plan with the pt(& family if present). He/she/they expressed understanding and agree(s) with the plan. Discharge instructions discussed at great length. Strict return precautions discussed and pt &/or family have verbalized understanding of the instructions. No further questions at time of discharge.    New Prescriptions   PROCHLORPERAZINE (COMPAZINE) 10 MG TABLET    Take 1 tablet (10 mg total) by mouth 2 (two) times daily as needed for nausea or vomiting.    Follow Up: [place] [id number] [phone] Schedule an appointment as soon as possible for a visit         Tegeler, Gwenyth Allegra, MD 10/18/17 [phone]

## 2017-10-17 NOTE — ED Notes (Signed)
Patient transported to CT 

## 2019-01-23 ENCOUNTER — Emergency Department (HOSPITAL_COMMUNITY)
Admission: EM | Admit: 2019-01-23 | Discharge: 2019-01-24 | Disposition: A | Discharge status: Home / Self Care | Payer: Self-pay | Attending: Emergency Medicine | Admitting: Emergency Medicine

## 2019-01-23 ENCOUNTER — Telehealth: Payer: Self-pay | Admitting: Emergency Medicine

## 2019-01-23 ENCOUNTER — Other Ambulatory Visit: Payer: Self-pay

## 2019-01-23 ENCOUNTER — Encounter (HOSPITAL_COMMUNITY): Payer: Self-pay | Admitting: Emergency Medicine

## 2019-01-23 DIAGNOSIS — I10 Essential (primary) hypertension: Secondary | ICD-10-CM | POA: Insufficient documentation

## 2019-01-23 DIAGNOSIS — N200 Calculus of kidney: Secondary | ICD-10-CM | POA: Insufficient documentation

## 2019-01-23 DIAGNOSIS — R109 Unspecified abdominal pain: Secondary | ICD-10-CM

## 2019-01-23 DIAGNOSIS — F17210 Nicotine dependence, cigarettes, uncomplicated: Secondary | ICD-10-CM | POA: Insufficient documentation

## 2019-01-23 DIAGNOSIS — Z79899 Other long term (current) drug therapy: Secondary | ICD-10-CM | POA: Insufficient documentation

## 2019-01-23 HISTORY — DX: Calculus of kidney: N20.0

## 2019-01-23 LAB — CBC WITH DIFFERENTIAL/PLATELET
Abs Immature Granulocytes: 0.08 10*3/uL — ABNORMAL HIGH (ref 0.00–0.07)
Basophils Absolute: 0.1 10*3/uL (ref 0.0–0.1)
Basophils Relative: 1 %
Eosinophils Absolute: 0.1 10*3/uL (ref 0.0–0.5)
Eosinophils Relative: 1 %
HCT: 42.6 % (ref 36.0–46.0)
Hemoglobin: 14 g/dL (ref 12.0–15.0)
Immature Granulocytes: 1 %
Lymphocytes Relative: 31 %
Lymphs Abs: 4.7 10*3/uL — ABNORMAL HIGH (ref 0.7–4.0)
MCH: 32.3 pg (ref 26.0–34.0)
MCHC: 32.9 g/dL (ref 30.0–36.0)
MCV: 98.2 fL (ref 80.0–100.0)
Monocytes Absolute: 1 10*3/uL (ref 0.1–1.0)
Monocytes Relative: 6 %
Neutro Abs: 9.3 10*3/uL — ABNORMAL HIGH (ref 1.7–7.7)
Neutrophils Relative %: 60 %
Platelets: 315 10*3/uL (ref 150–400)
RBC: 4.34 MIL/uL (ref 3.87–5.11)
RDW: 12.1 % (ref 11.5–15.5)
WBC: 15.2 10*3/uL — ABNORMAL HIGH (ref 4.0–10.5)
nRBC: 0 % (ref 0.0–0.2)

## 2019-01-23 LAB — URINALYSIS, ROUTINE W REFLEX MICROSCOPIC
Bilirubin Urine: NEGATIVE
Glucose, UA: NEGATIVE mg/dL
Hgb urine dipstick: NEGATIVE
Ketones, ur: NEGATIVE mg/dL
Leukocytes,Ua: NEGATIVE
Nitrite: NEGATIVE
Protein, ur: NEGATIVE mg/dL
Specific Gravity, Urine: 1.006 (ref 1.005–1.030)
pH: 7 (ref 5.0–8.0)

## 2019-01-23 LAB — COMPREHENSIVE METABOLIC PANEL
ALT: 19 U/L (ref 0–44)
AST: 35 U/L (ref 15–41)
Albumin: 4 g/dL (ref 3.5–5.0)
Alkaline Phosphatase: 73 U/L (ref 38–126)
Anion gap: 9 (ref 5–15)
BUN: 8 mg/dL (ref 6–20)
CO2: 25 mmol/L (ref 22–32)
Calcium: 8.9 mg/dL (ref 8.9–10.3)
Chloride: 103 mmol/L (ref 98–111)
Creatinine, Ser: 0.67 mg/dL (ref 0.44–1.00)
GFR calc Af Amer: >60 mL/min (ref >60)
GFR calc non Af Amer: >60 mL/min (ref >60)
Glucose, Bld: 112 mg/dL — ABNORMAL HIGH (ref 70–99)
Potassium: 3.7 mmol/L (ref 3.5–5.1)
Sodium: 137 mmol/L (ref 135–145)
Total Bilirubin: 0.5 mg/dL (ref 0.3–1.2)
Total Protein: 6.6 g/dL (ref 6.5–8.1)

## 2019-01-23 LAB — I-STAT BETA HCG BLOOD, ED (MC, WL, AP ONLY): I-stat hCG, quantitative: <5 m[IU]/mL (ref <5)

## 2019-01-23 NOTE — ED Triage Notes (Signed)
Pt c/o pain in RLQ radiating around to back onset yesterday worse today. Urinary frequency with small amounts

## 2019-01-23 NOTE — Progress Notes (Signed)
I spoke directly to the patient via telephone call to get clarification on symptoms. She has severe flank pain, pain with urination, feels the same as previous kidney stone. Recommend urgent F2F evaluation   Based on what you shared with me, I feel your condition warrants further evaluation and I recommend that you be seen for a face to face office visit.   NOTE: If you entered your credit card information for this eVisit, you will not be charged. You may see a "hold" on your card for the $35 but that hold will drop off and you will not have a charge processed.   If you are having a true medical emergency please call 911.      For an urgent face to face visit, [facility] has five urgent care centers for your convenience:      NEW:  [facility] [place] [facility] [place] Get Driving Directions [id number] [address] [place], [place] [address] . 10 am - 6pm Monday - Friday    [facility] [place] [facility] [place]) Get Driving Directions [id number] [address] [place], [place] [address] . 10 am to 8 pm Monday-Friday . 12 pm to 8 pm [facility] Get Driving Directions [id number] [place], [place], [place] [address] . 8 am to 8 pm Monday-Friday . 9 am to 6 pm Saturday . 11 am to 6 pm Sunday     [facility] Get Driving Directions  [id number] [address] [place], [place] [address] . 8 am to 8 pm Monday-Friday . 8 am to 4 pm [facility] at [place] Get Driving Directions [id number] [place] [place],  [address] . 12 pm to 6 pm Monday-Friday      Your e-visit answers were reviewed by a board certified advanced clinical practitioner to complete your personal care plan.  Thank you for using e-Visits.    Greater than 5 but less than 10 minutes spent researching,  coordinating, and implementing care for this patient today

## 2019-01-24 ENCOUNTER — Emergency Department (HOSPITAL_COMMUNITY): Payer: Self-pay

## 2019-01-24 MED ORDER — ONDANSETRON HCL 4 MG/2ML IJ SOLN
4.0000 mg | Freq: Once | INTRAMUSCULAR | Status: AC
  Administered 2019-01-24: 4 mg via INTRAVENOUS
  Filled 2019-01-24: qty 2

## 2019-01-24 MED ORDER — TAMSULOSIN HCL 0.4 MG PO CAPS: 0.4000 mg | ORAL_CAPSULE | Freq: Every day | ORAL | 0 refills | Status: DC

## 2019-01-24 MED ORDER — MORPHINE SULFATE 15 MG PO TABS: 15.0000 mg | ORAL_TABLET | ORAL | 0 refills | Status: DC | PRN

## 2019-01-24 MED ORDER — MORPHINE SULFATE (PF) 4 MG/ML IV SOLN
8.0000 mg | Freq: Once | INTRAVENOUS | Status: AC
  Administered 2019-01-24: 04:00:00 8 mg via INTRAVENOUS
  Filled 2019-01-24: qty 2

## 2019-01-24 MED ORDER — SODIUM CHLORIDE 0.9 % IV BOLUS
1000.0000 mL | Freq: Once | INTRAVENOUS | Status: AC
  Administered 2019-01-24: 1000 mL via INTRAVENOUS

## 2019-01-24 MED ORDER — KETOROLAC TROMETHAMINE 15 MG/ML IJ SOLN
15.0000 mg | Freq: Once | INTRAMUSCULAR | Status: AC
  Administered 2019-01-24: 15 mg via INTRAVENOUS
  Filled 2019-01-24: qty 1

## 2019-01-24 MED ORDER — ONDANSETRON 4 MG PO TBDP: 4.0000 mg | ORAL_TABLET | Freq: Three times a day (TID) | ORAL | 0 refills | Status: DC | PRN

## 2019-01-24 NOTE — Discharge Instructions (Signed)

## 2019-01-24 NOTE — ED Provider Notes (Signed)
[facility] EMERGENCY DEPARTMENT Provider Note   CSN: [id number] Arrival date & time: 01/23/19  1936     History Chief Complaint  Patient presents with  . Abdominal Pain  . Back Pain    Dominique Stevens is a 44 y.o. female.  44 yo F with a chief complaint of right flank pain.  Sharp and shooting.  Started this morning worsened about lunchtime.  Starts in the right lower quadrant and then radiates to the back.  Is worse with movement palpation and twisting.  She denies trauma to the back or the abdomen.  Denies fevers.  Has had some nausea but no vomiting.  No diarrhea.  She has a history of a kidney stone in the past.  Thinks this feels similar.  History of cholecystectomy and endometrial ablation.  Denies other abdominal surgeries.  Denies vaginal bleeding.  She has some urinary urgency.  The history is provided by the patient.  Abdominal Pain Pain location:  RLQ Pain quality: sharp and shooting   Pain severity:  Moderate Onset quality:  Gradual Duration:  1 day Timing:  Constant Progression:  Worsening Chronicity:  New Relieved by:  Nothing Worsened by:  Movement and palpation Ineffective treatments:  None tried Associated symptoms: nausea   Associated symptoms: no chest pain, no chills, no dysuria, no fever, no shortness of breath and no vomiting   Back Pain Associated symptoms: abdominal pain   Associated symptoms: no chest pain, no dysuria, no fever and no headaches        Past Medical History:  Diagnosis Date  . Anxiety   . Hypertension   . Kidney stones     There are no problems to display for this patient.   Past Surgical History:  Procedure Laterality Date  . ABLATION    . CHOLECYSTECTOMY    . TUBAL LIGATION       OB History   No obstetric history on file.     Family History  Problem Relation Age of Onset  . Hypertension Mother   . Hypertension Maternal Grandmother   . Heart disease Maternal Grandfather     Social History    Tobacco Use  . Smoking status: Current Every Day Smoker    Packs/day: 0.50    Years: 20.00    Pack years: 10.00    Types: Cigarettes  . Smokeless tobacco: Current User  Substance Use Topics  . Alcohol use: Yes  . Drug use: No    Home Medications Prior to Admission medications   Medication Sig Start Date End Date Taking? Authorizing Provider  lisinopril-hydrochlorothiazide (PRINZIDE,ZESTORETIC) 10-12.5 MG per tablet Take 1 tablet by mouth daily. Patient not taking: Reported on 10/17/2017 10/04/13   Harrison Mons, PA  morphine (MSIR) 15 MG tablet Take 1 tablet (15 mg total) by mouth every 4 (four) hours as needed for severe pain. 01/24/19   Deno Etienne, DO  omeprazole (PRILOSEC) 40 MG capsule Take 40 mg by mouth daily.    [provider]  ondansetron (ZOFRAN ODT) 4 MG disintegrating tablet Take 1 tablet (4 mg total) by mouth every 8 (eight) hours as needed for nausea or vomiting. 01/24/19   Deno Etienne, DO  prochlorperazine (COMPAZINE) 10 MG tablet Take 1 tablet (10 mg total) by mouth 2 (two) times daily as needed for nausea or vomiting. 10/17/17   Tegeler, Gwenyth Allegra, MD  promethazine (PHENERGAN) 12.5 MG tablet Take 1 tablet (12.5 mg total) by mouth every 8 (eight) hours as needed for nausea.  Patient not taking: Reported on 10/17/2017 03/12/13   Rise Mu, PA-C  tamsulosin (FLOMAX) 0.4 MG CAPS capsule Take 1 capsule (0.4 mg total) by mouth daily after supper. 01/24/19   Deno Etienne, DO    Allergies    Patient has no known allergies.  Review of Systems   Review of Systems  Constitutional: Negative for chills and fever.  HENT: Negative for congestion and rhinorrhea.   Eyes: Negative for redness and visual disturbance.  Respiratory: Negative for shortness of breath and wheezing.   Cardiovascular: Negative for chest pain and palpitations.  Gastrointestinal: Positive for abdominal pain and nausea. Negative for vomiting.  Genitourinary: Negative for dysuria and urgency.   Musculoskeletal: Positive for back pain. Negative for arthralgias and myalgias.  Skin: Negative for pallor and wound.  Neurological: Negative for dizziness and headaches.    Physical Exam Updated Vital Signs BP (!) 131/97   Pulse 92   Temp 98.1 F (36.7 C) (Oral)   Resp 20   Ht 5\' 7"  (1.702 m)   Wt 65.8 kg   SpO2 100%   BMI 22.71 kg/m   Physical Exam Vitals and nursing note reviewed.  Constitutional:      General: She is not in acute distress.    Appearance: She is well-developed. She is not diaphoretic.  HENT:     Head: Normocephalic and atraumatic.  Eyes:     Pupils: Pupils are equal, round, and reactive to light.  Cardiovascular:     Rate and Rhythm: Normal rate and regular rhythm.     Heart sounds: No murmur. No friction rub. No gallop.   Pulmonary:     Effort: Pulmonary effort is normal.     Breath sounds: No wheezing or rales.  Abdominal:     General: There is no distension.     Palpations: Abdomen is soft.     Tenderness: There is abdominal tenderness.     Comments: Tenderness to the right lower quadrant and some mild epigastric pain.  No CVA tenderness.  Musculoskeletal:        General: No tenderness.     Cervical back: Normal range of motion and neck supple.  Skin:    General: Skin is warm and dry.  Neurological:     Mental Status: She is alert and oriented to person, place, and time.  Psychiatric:        Behavior: Behavior normal.     ED Results / Procedures / Treatments   Labs (all labs ordered are listed, but only abnormal results are displayed) Labs Reviewed  CBC WITH DIFFERENTIAL/PLATELET - Abnormal; Notable for the following components:      Result Value   WBC 15.2 (*)    Neutro Abs 9.3 (*)    Lymphs Abs 4.7 (*)    Abs Immature Granulocytes 0.08 (*)    All other components within normal limits  COMPREHENSIVE METABOLIC PANEL - Abnormal; Notable for the following components:   Glucose, Bld 112 (*)    All other components within normal  limits  URINALYSIS, ROUTINE W REFLEX MICROSCOPIC - Abnormal; Notable for the following components:   APPearance CLOUDY (*)    Bacteria, UA RARE (*)    All other components within normal limits  I-STAT BETA HCG BLOOD, ED ([facility], [facility], [facility] ONLY)    EKG None  Radiology CT Renal Stone Study  Result Date: 01/24/2019 CLINICAL DATA:  Flank pain. Right lower quadrant pain. Urinary frequency. EXAM: CT ABDOMEN AND PELVIS WITHOUT CONTRAST TECHNIQUE: Multidetector CT  imaging of the abdomen and pelvis was performed following the standard protocol without IV contrast. COMPARISON:  CT 07/07/2016 at [facility]: Lower chest: No acute abnormality. Hepatobiliary: No focal liver abnormality is seen. Status post cholecystectomy. No biliary dilatation. Pancreas: No ductal dilatation or inflammation. Spleen: Normal in size without focal abnormality. Adrenals/Urinary Tract: Normal adrenal glands. Mild right hydronephrosis. No ureteral dilatation. No renal or ureteral calculi. No left hydronephrosis. Urinary bladder is mildly distended. No bladder stone or wall thickening. Stomach/Bowel: Stomach is unremarkable. No small bowel obstruction or inflammation. Moderate stool burden throughout the colon. Scattered radiopaque densities in the colon have the appearance of ingested pills. The appendix is normal, series 3, image 59. Vascular/Lymphatic: No enlarged lymph nodes in the abdomen or pelvis. The abdominal aorta is normal in caliber. Reproductive: Uterus and bilateral adnexa are unremarkable. Other: No free air, free fluid, or intra-abdominal fluid collection. 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 There are no acute or suspicious osseous abnormalities. IMPRESSION: 1. Mild right hydronephrosis without ureteral dilatation or obstructing stone. Findings may be secondary to recently passed stone or urinary tract infection. 2. Normal appendix. 3. Moderate stool  burden throughout the colon, can be seen with constipation. Electronically Signed   By: Keith Rake M.D.   On: 01/24/2019 03:56    Procedures Procedures (including critical care time)  Medications Ordered in ED Medications  morphine 4 MG/ML injection 8 mg (8 mg Intravenous Given 01/24/19 0330)  ondansetron (ZOFRAN) injection 4 mg (4 mg Intravenous Given 01/24/19 0330)  sodium chloride 0.9 % bolus 1,000 mL (0 mLs Intravenous Stopped 01/24/19 0445)  ketorolac (TORADOL) 15 MG/ML injection 15 mg (15 mg Intravenous Given 01/24/19 0440)    ED Course  I have reviewed the triage vital signs and the nursing notes.  Pertinent labs & imaging results that were available during my care of the patient were reviewed by me and considered in my medical decision making (see chart for details).    MDM Rules/Calculators/A&P                      44 yo F with a chief complaints of right C.  On my exam she is very tender on exam and her history is not completely consistent with a stone, seems like it is positional and she is got some positional pain as well as does not appear to be colicky.  We will treat her pain and nausea and obtain a CT scan to further evaluate.  CT with right-sided hydronephrosis.  No visualized stone.  UA is not consistent with a urinary tract infection for me.  No fevers.  I think most likely the patient has a persistent stone that may be was in between slices.  She continues to have some discomfort but significant improved.  We will have her follow-up with urology in the office.  4:47 AM:  I have discussed the diagnosis/risks/treatment options with the patient and believe the pt to be eligible for discharge home to follow-up with PCP. We also discussed returning to the ED immediately if new or worsening sx occur. We discussed the sx which are most concerning (e.g., sudden worsening pain, fever, inability to tolerate by mouth) that necessitate immediate return. Medications administered  to the patient during their visit and any new prescriptions provided to the patient are listed below.  Medications given during this visit Medications  morphine 4 MG/ML injection 8 mg (8 mg Intravenous Given  01/24/19 0330)  ondansetron (ZOFRAN) injection 4 mg (4 mg Intravenous Given 01/24/19 0330)  sodium chloride 0.9 % bolus 1,000 mL (0 mLs Intravenous Stopped 01/24/19 0445)  ketorolac (TORADOL) 15 MG/ML injection 15 mg (15 mg Intravenous Given 01/24/19 0440)     The patient appears reasonably screen and/or stabilized for discharge and I doubt any other medical condition or other [facility] requiring further screening, evaluation, or treatment in the ED at this time prior to discharge.   Final Clinical Impression(s) / ED Diagnoses Final diagnoses:  Nephrolithiasis    Rx / DC Orders ED Discharge Orders         Ordered    morphine (MSIR) 15 MG tablet  Every 4 hours PRN     01/24/19 0432    ondansetron (ZOFRAN ODT) 4 MG disintegrating tablet  Every 8 hours PRN     01/24/19 0432    tamsulosin (FLOMAX) 0.4 MG CAPS capsule  Daily after supper     01/24/19 [place], Parnell, DO 01/24/19 [phone]

## 2019-12-25 IMAGING — CR DG CHEST 2V
2 series · 2 of 2 positions shown · non-contrast
Comparison: 01/21/2015

CLINICAL DATA: Chest pain

EXAM:
CHEST - 2 VIEW

[chest pa]
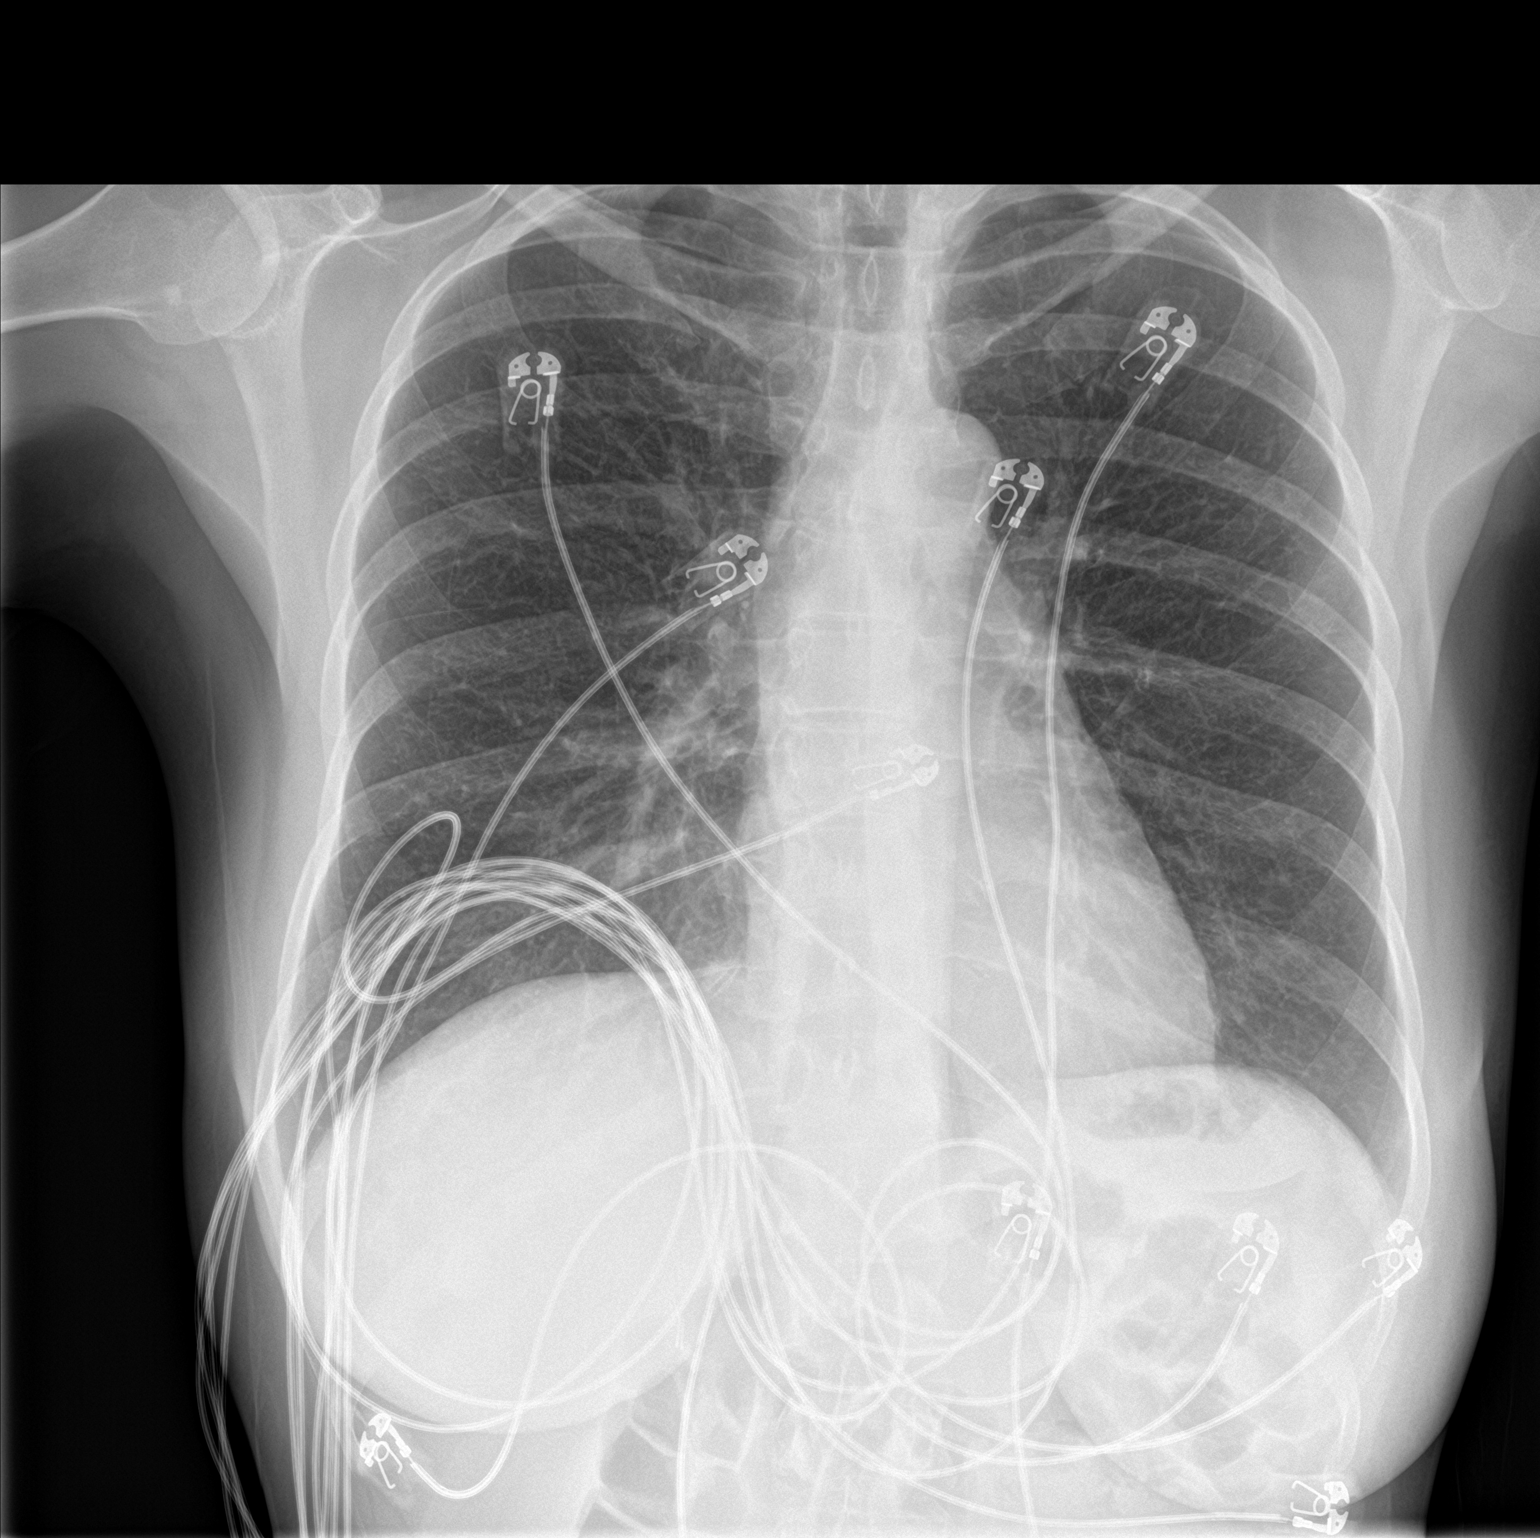

[chest lat]
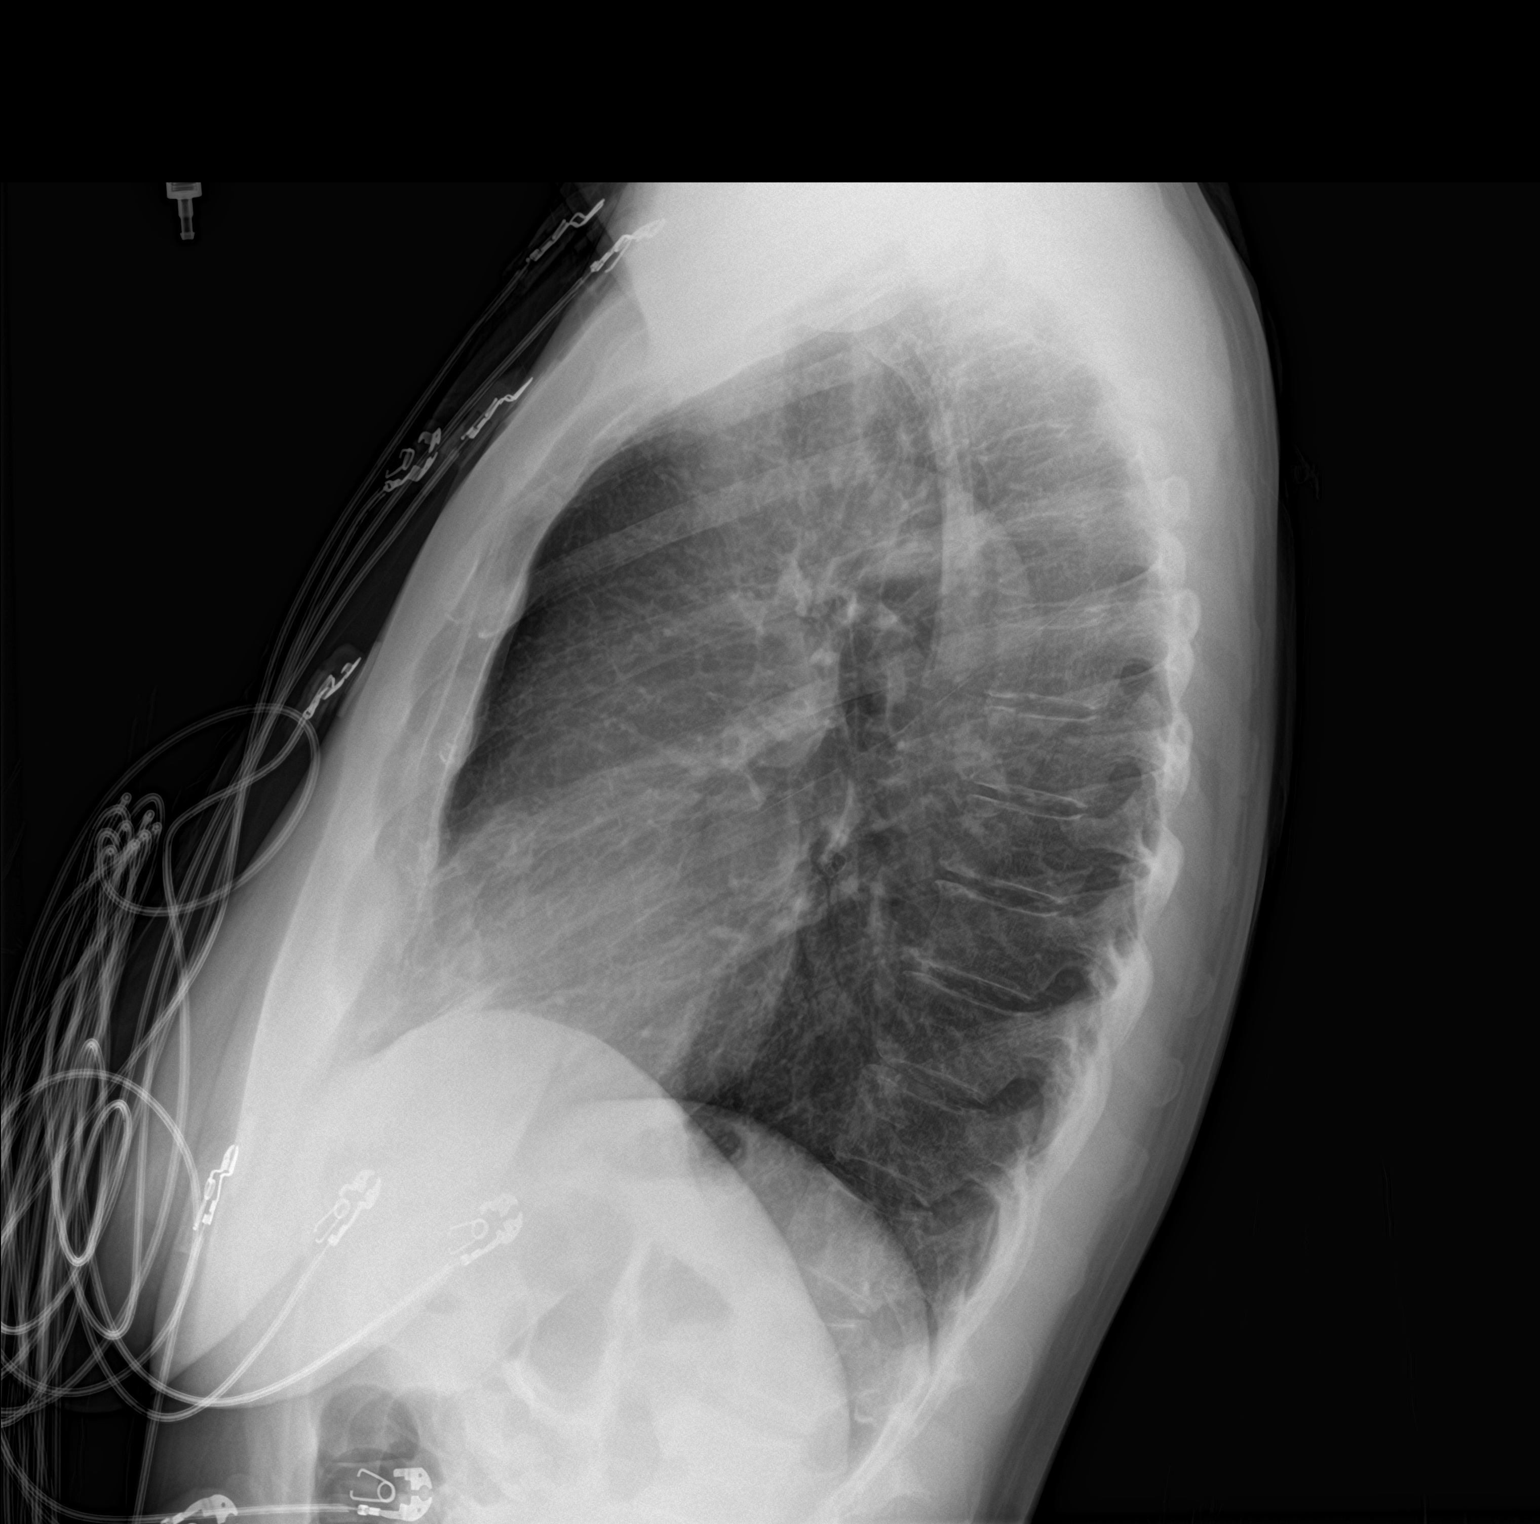

[2 of 2 positions shown; findings below may reference images not displayed]

FINDINGS: The heart size and mediastinal contours are within normal limits.
Both lungs are clear. The visualized skeletal structures are
unremarkable.
IMPRESSION: No active cardiopulmonary disease.

## 2021-04-02 IMAGING — CT CT RENAL STONE PROTOCOL
2 of 4 series · 16 of 46 positions shown, 18 images · non-contrast
Comparison: CT 07/07/2016 at [HOSPITAL]

CLINICAL DATA: Flank pain. Right lower quadrant pain. Urinary
frequency.

EXAM:
CT ABDOMEN AND PELVIS WITHOUT CONTRAST
TECHNIQUE: Multidetector CT imaging of the abdomen and pelvis was performed
following the standard protocol without IV contrast.

[Series 3: renal stone 5.0 · axial · 0.77mm/px · z∈[+564,+984]mm · 13 of 95 slices shown, 15 images]
[im 7/95  soft-tissue]
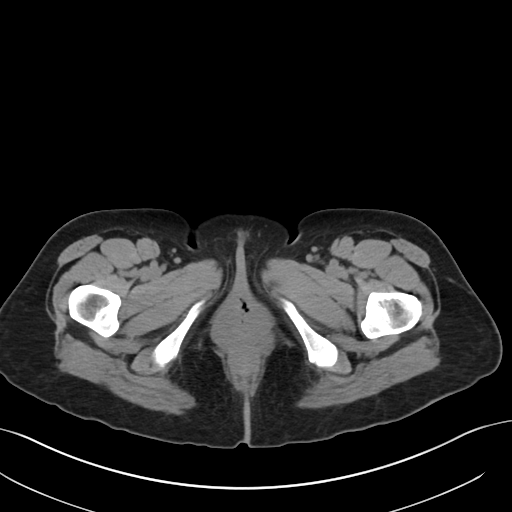
[im 7/95  bone]
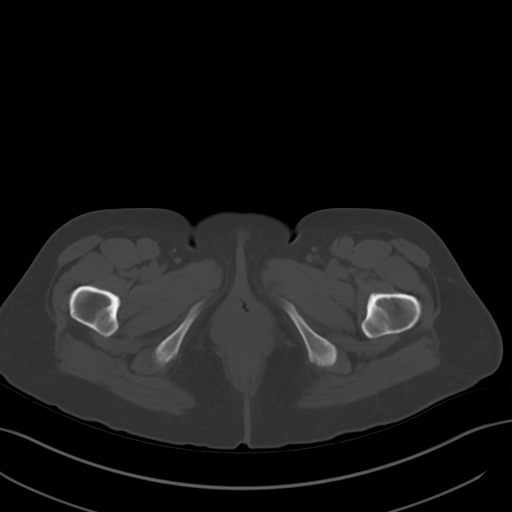
[im 14/95  soft-tissue]
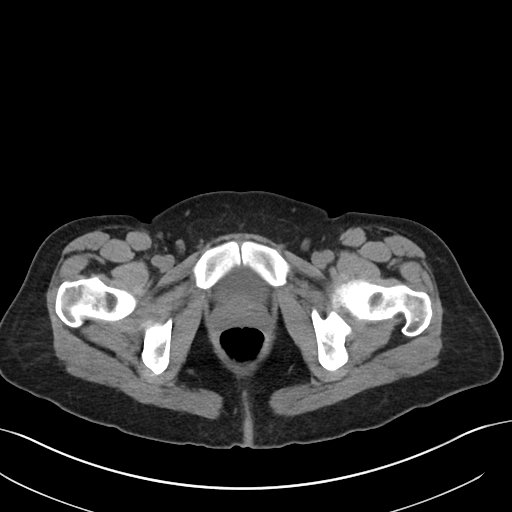
[im 21/95  soft-tissue]
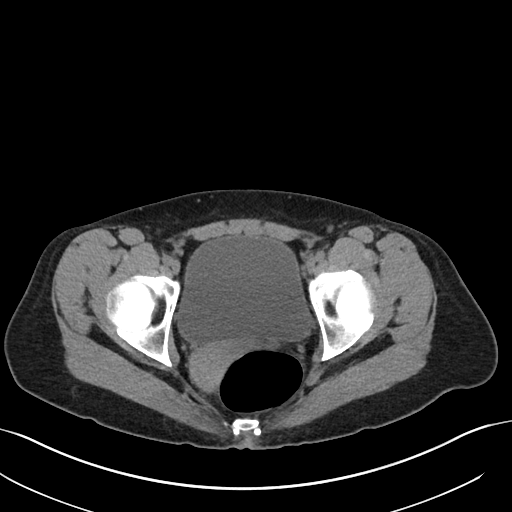
[im 28/95  soft-tissue]
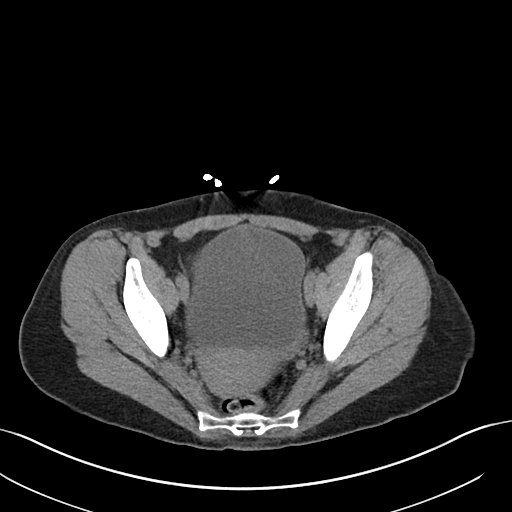
[im 35/95  soft-tissue]
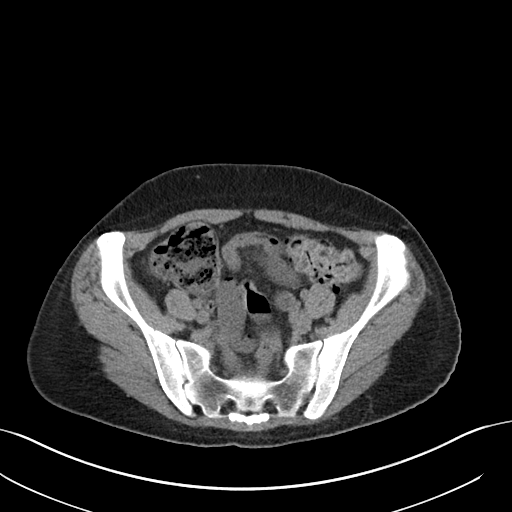
[im 42/95  soft-tissue]
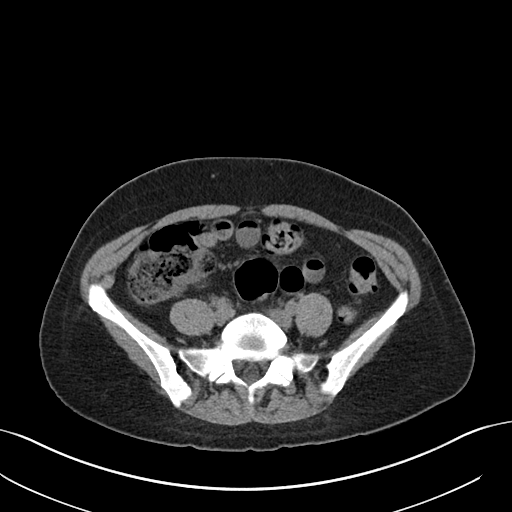
[im 49/95  soft-tissue]
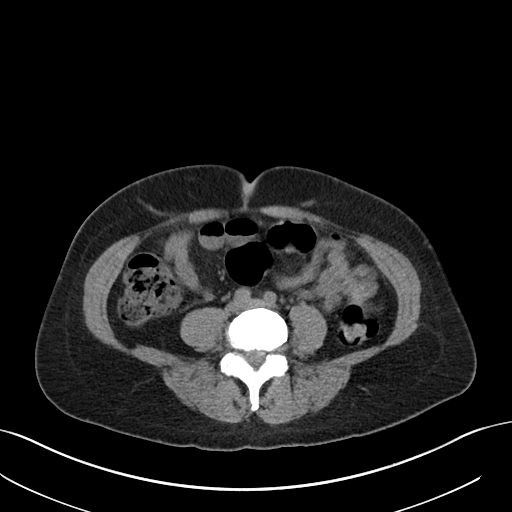
[im 56/95  soft-tissue]
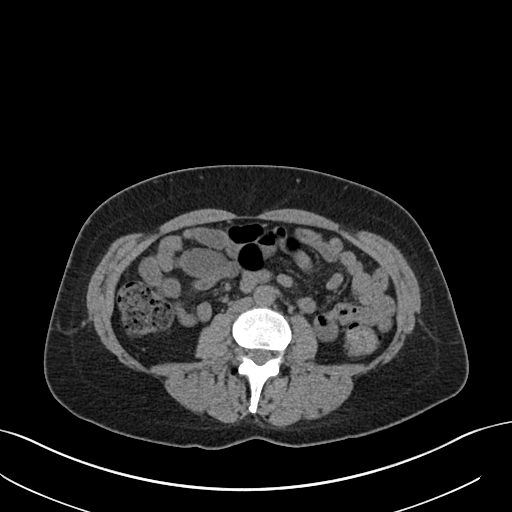
[im 63/95  soft-tissue]
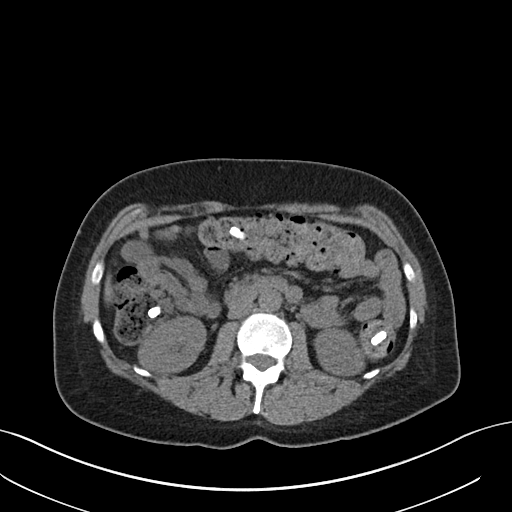
[im 63/95  bone]
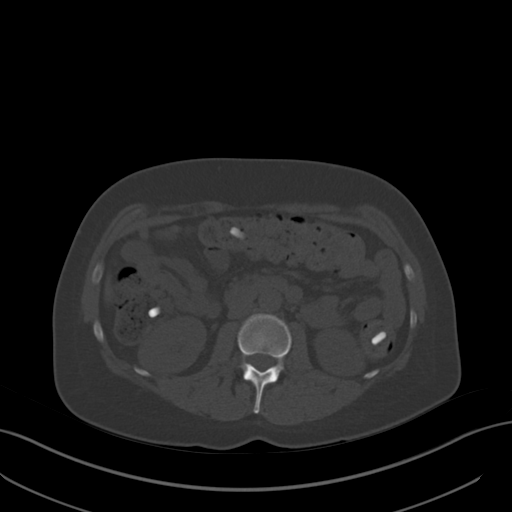
[im 70/95  soft-tissue]
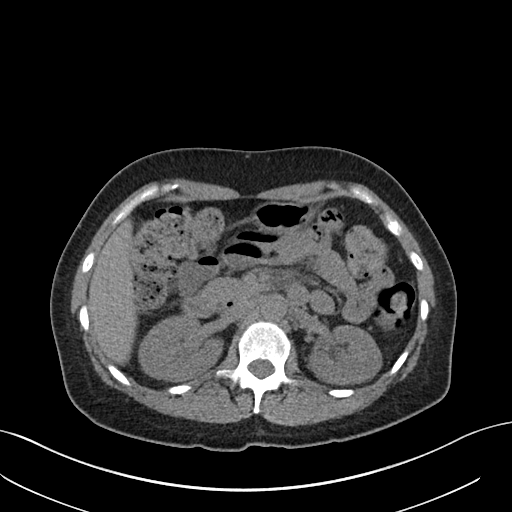
[im 77/95  soft-tissue]
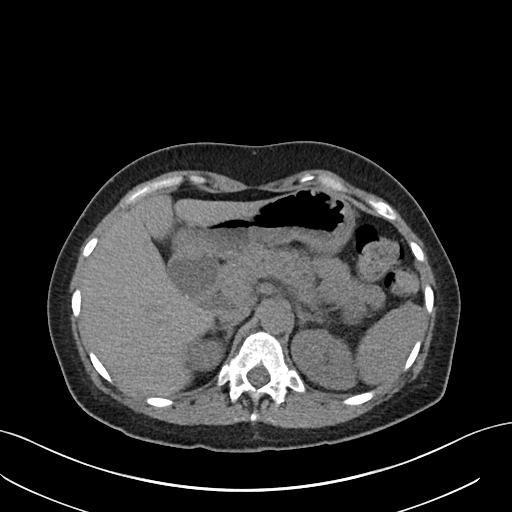
[im 84/95  soft-tissue]
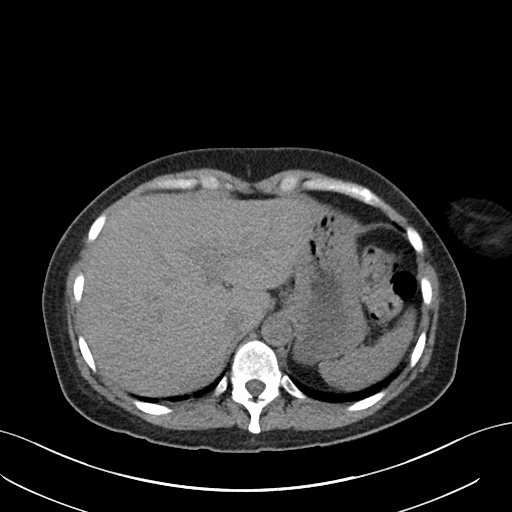
[im 91/95  soft-tissue]
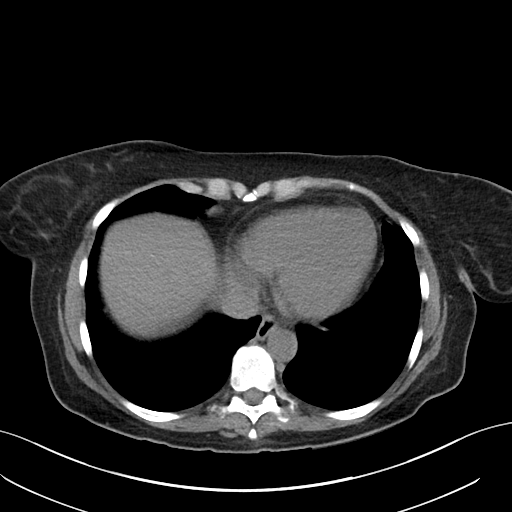

[Series 6: renal stone 3.0 cor · coronal · 0.65mm/px · 3 of 77 slices shown]
[im 26/77  soft-tissue]
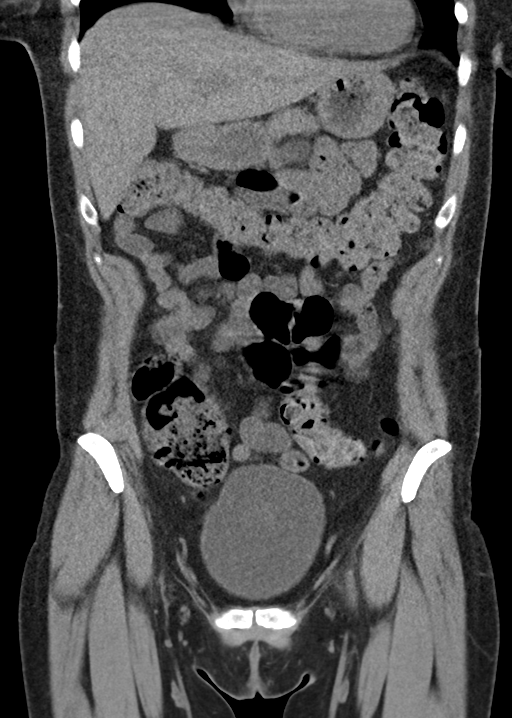
[im 34/77  soft-tissue]
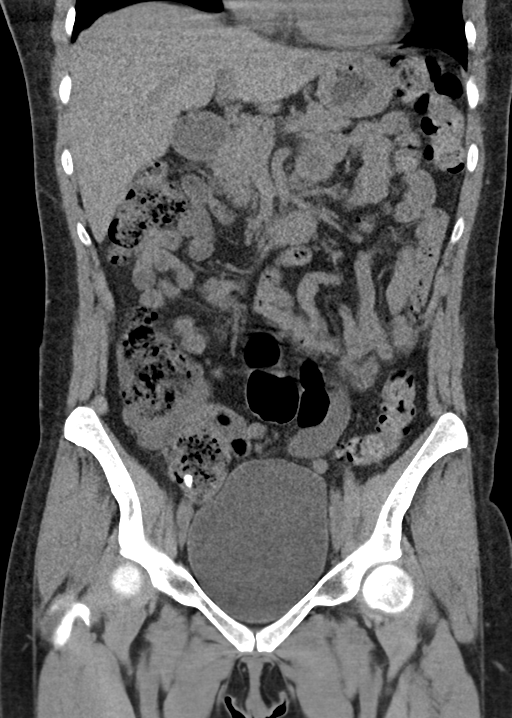
[im 43/77  soft-tissue]
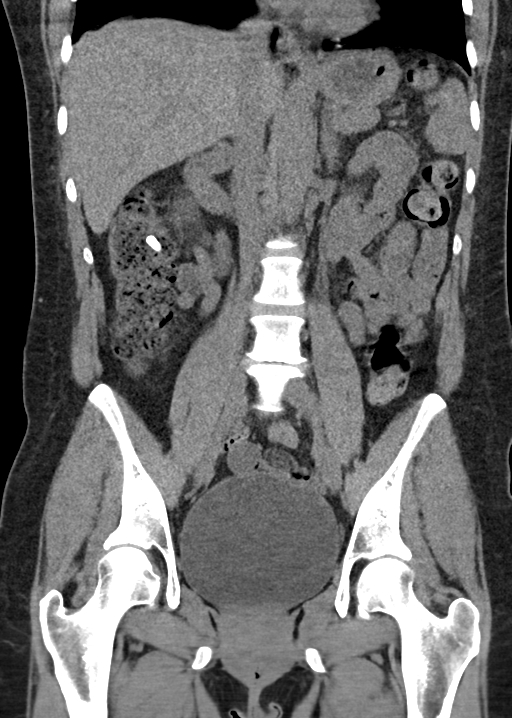

[16 of 46 positions shown; findings below may reference images not displayed]

FINDINGS: Lower chest: No acute abnormality.

Hepatobiliary: No focal liver abnormality is seen. Status post
cholecystectomy. No biliary dilatation.

Pancreas: No ductal dilatation or inflammation.

Spleen: Normal in size without focal abnormality.

Adrenals/Urinary Tract: Normal adrenal glands. Mild right
hydronephrosis. No ureteral dilatation. No renal or ureteral
calculi. No left hydronephrosis. Urinary bladder is mildly
distended. No bladder stone or wall thickening.

Stomach/Bowel: Stomach is unremarkable. No small bowel obstruction
or inflammation. Moderate stool burden throughout the colon.
Scattered radiopaque densities in the colon have the appearance of
ingested pills. The appendix is normal, series 3, image 59.

Vascular/Lymphatic: No enlarged lymph nodes in the abdomen or
pelvis. The abdominal aorta is normal in caliber.

Reproductive: Uterus and bilateral adnexa are unremarkable.

Other: No free air, free fluid, or intra-abdominal fluid collection.

Musculoskeletal: Degenerative disc disease at L4-L5 with Modic
endplate changes, unchanged from prior. There are no acute or
suspicious osseous abnormalities.
IMPRESSION: 1. Mild right hydronephrosis without ureteral dilatation or
obstructing stone. Findings may be secondary to recently passed
stone or urinary tract infection.
2. Normal appendix.
3. Moderate stool burden throughout the colon, can be seen with
constipation.

## 2021-04-07 ENCOUNTER — Ambulatory Visit
Admission: EM | Admit: 2021-04-07 | Discharge: 2021-04-07 | Disposition: A | Discharge status: Home / Self Care | Payer: 59

## 2021-04-07 ENCOUNTER — Other Ambulatory Visit: Payer: Self-pay

## 2021-04-07 DIAGNOSIS — M79601 Pain in right arm: Secondary | ICD-10-CM

## 2021-04-07 MED ORDER — KETOROLAC TROMETHAMINE 30 MG/ML IJ SOLN
30.0000 mg | Freq: Once | INTRAMUSCULAR | Status: AC
  Administered 2021-04-07: 30 mg via INTRAMUSCULAR

## 2021-04-07 MED ORDER — PREDNISONE 20 MG PO TABS: 40.0000 mg | ORAL_TABLET | Freq: Every day | ORAL | 0 refills | Status: AC

## 2021-04-07 NOTE — ED Provider Notes (Addendum)
?[facility] ? ? ? ?CSN: [id number] ?Arrival date & time: 04/07/21  1831 ? ? ?  ? ?History   ?Chief Complaint ?Chief Complaint  ?Patient presents with  ? Arm Pain  ? Neck Pain  ? ? ?HPI ?Dominique Stevens is a 46 y.o. female.  ? ?Patient presents with right arm pain that has been present approximately 1 month.  Patient reports that pain has seemed to worsen over the past few days with associated numbness and tingling.  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  Patient did fall in November 2022 on right arm but had negative imaging per patient.  Patient has been using ibuprofen, meloxicam, Biofreeze, Voltaren gel, Tylenol, muscle relaxer with no improvement in pain.  Movement exacerbates pain. ? ? ?Arm Pain ? ?Neck Pain ? ?Past Medical History:  ?Diagnosis Date  ? Anxiety   ? Hypertension   ? Kidney stones   ? ? ?There are no problems to display for this patient. ? ? ?Past Surgical History:  ?Procedure Laterality Date  ? ABLATION    ? CHOLECYSTECTOMY    ? TUBAL LIGATION    ? ? ?OB History   ?No obstetric history on file. ?  ? ? ? ?Home Medications   ? ?Prior to Admission medications   ?Medication Sig Start Date End Date Taking? Authorizing Provider  ?Loratadine (CLARITIN PO) Take by mouth.   Yes [provider]  ?predniSONE (DELTASONE) 20 MG tablet Take 2 tablets (40 mg total) by mouth daily for 5 days. 04/07/21 04/12/21 Yes Teodora Medici, FNP  ?lisinopril-hydrochlorothiazide (PRINZIDE,ZESTORETIC) 10-12.5 MG per tablet Take 1 tablet by mouth daily. ?Patient not taking: Reported on 10/17/2017 10/04/13   Harrison Mons, PA  ?morphine (MSIR) 15 MG tablet Take 1 tablet (15 mg total) by mouth every 4 (four) hours as needed for severe pain. 01/24/19   Deno Etienne, DO  ?omeprazole (PRILOSEC) 40 MG capsule Take 40 mg by mouth daily.    [provider]  ?ondansetron (ZOFRAN ODT) 4 MG disintegrating tablet Take 1 tablet (4 mg  total) by mouth every 8 (eight) hours as needed for nausea or vomiting. 01/24/19   Deno Etienne, DO  ?prochlorperazine (COMPAZINE) 10 MG tablet Take 1 tablet (10 mg total) by mouth 2 (two) times daily as needed for nausea or vomiting. 10/17/17   Tegeler, Gwenyth Allegra, MD  ?promethazine (PHENERGAN) 12.5 MG tablet Take 1 tablet (12.5 mg total) by mouth every 8 (eight) hours as needed for nausea. ?Patient not taking: Reported on 10/17/2017 03/12/13   Rise Mu, PA-C  ?tamsulosin (FLOMAX) 0.4 MG CAPS capsule Take 1 capsule (0.4 mg total) by mouth daily after supper. 01/24/19   Deno Etienne, DO  ? ? ?Family History ?Family History  ?Problem Relation Age of Onset  ? Hypertension Mother   ? Hypertension Maternal Grandmother   ? Heart disease Maternal Grandfather   ? ? ?Social History ?Social History  ? ?Tobacco Use  ? Smoking status: Every Day  ?  Packs/day: 0.50  ?  Years: 20.00  ?  Pack years: 10.00  ?  Types: Cigarettes  ? Smokeless tobacco: Current  ?Substance Use Topics  ? Alcohol use: Yes  ? Drug use: No  ? ? ? ?Allergies   ?Patient has no known allergies. ? ? ?Review of Systems ?Review of Systems ?Per HPI ? ?Physical Exam ?Triage Vital Signs ?ED Triage Vitals  ?  Enc Vitals Group  ?   BP 04/07/21 1932 (!) 141/100  ?   Pulse Rate 04/07/21 1932 99  ?   Resp 04/07/21 1932 18  ?   Temp 04/07/21 1932 98.8 ?F (37.1 ?C)  ?   Temp Source 04/07/21 1932 Oral  ?   SpO2 04/07/21 1932 97 %  ?   Weight --   ?   Height --   ?   Head Circumference --   ?   Peak Flow --   ?   Pain Score 04/07/21 1930 7  ?   Pain Loc --   ?   Pain Edu? --   ?   Excl. in [place]? --   ? ?No data found. ? ?Updated Vital Signs ?BP (!) 141/100 (BP Location: Left Arm)   Pulse 99   Temp 98.8 ?F (37.1 ?C) (Oral)   Resp 18   SpO2 97%  ? ?Visual Acuity ?Right Eye Distance:   ?Left Eye Distance:   ?Bilateral Distance:   ? ?Right Eye Near:   ?Left Eye Near:    ?Bilateral Near:    ? ?Physical Exam ?Constitutional:   ?   General: She is not in acute distress. ?    Appearance: Normal appearance. She is not toxic-appearing or diaphoretic.  ?HENT:  ?   Head: Normocephalic and atraumatic.  ?Eyes:  ?   Extraocular Movements: Extraocular movements intact.  ?   Conjunctiva/sclera: Conjunctivae normal.  ?Pulmonary:  ?   Effort: Pulmonary effort is normal.  ?Musculoskeletal:  ?   Comments: Tenderness to palpation to right trapezius muscle.  Tenderness to palpation to posterior right upper arm.  Tenderness to palpation at right lateral epicondyle.  Grip strength 4/5.  Neurovascular intact.  ?Neurological:  ?   General: No focal deficit present.  ?   Mental Status: She is alert and oriented to person, place, and time. Mental status is at baseline.  ?Psychiatric:     ?   Mood and Affect: Mood normal.     ?   Behavior: Behavior normal.     ?   Thought Content: Thought content normal.     ?   Judgment: Judgment normal.  ? ? ? ?UC Treatments / Results  ?Labs ?(all labs ordered are listed, but only abnormal results are displayed) ?Labs Reviewed - No data to display ? ?EKG ? ? ?Radiology ?No results found. ? ?Procedures ?Procedures (including critical care time) ? ?Medications Ordered in UC ?Medications  ?ketorolac (TORADOL) 30 MG/ML injection 30 mg (30 mg Intramuscular Given 04/07/21 1958)  ? ? ?Initial Impression / Assessment and Plan / UC Course  ?I have reviewed the triage vital signs and the nursing notes. ? ?Pertinent labs & imaging results that were available during my care of the patient were reviewed by me and considered in my medical decision making (see chart for details). ? ?  ? ?Patient's pain appears muscular in nature.  No obvious current injuries so do not think that imaging is necessary at this time.  Differential diagnoses include trapezius strain versus bicep/tricep strap strain versus lateral epicondylitis.  Will treat with prednisone as patient's pain has been refractory to NSAIDs and ice application. Ketorolac IM administered today and patient advised to avoid NSAIDs with  this injection. Patient to follow-up with provided contact information for orthopedist for further evaluation and management.  Discussed return precautions.  Patient verbalized understanding and was agreeable with plan. ?Final Clinical Impressions(s) / UC Diagnoses  ? ?Final diagnoses:  ?  Right arm pain  ? ? ? ?Discharge Instructions   ? ?  ?You were given an injection in urgent care today.  Please avoid ibuprofen, Advil, Aleve, meloxicam for at least 24 hours following injection.  Prednisone has been prescribed for you to take as needed.  Please follow-up with orthopedist for further evaluation and management. ? ? ? ?ED Prescriptions   ? ? Medication Sig Dispense Auth. Provider  ? predniSONE (DELTASONE) 20 MG tablet Take 2 tablets (40 mg total) by mouth daily for 5 days. 10 tablet Oswaldo Conroy E, [place]  ? ?  ? ?PDMP not reviewed this encounter. ?  ?Teodora Medici, [place] ?04/07/21 1959 ? ?  ?Teodora Medici, [place] ?04/07/21 2000 ? ?

## 2021-04-07 NOTE — ED Triage Notes (Signed)
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 Treating pain with ibuprofen, Biofreeze, Voltaren, muscle relaxer with no improvement. She states over the past couple of weeks pain has worsened, has noted numbness in arm.  ?

## 2021-04-07 NOTE — Discharge Instructions (Signed)
You were given an injection in urgent care today.  Please avoid ibuprofen, Advil, Aleve, meloxicam for at least 24 hours following injection.  Prednisone has been prescribed for you to take as needed.  Please follow-up with orthopedist for further evaluation and management. ?

## 2021-04-08 ENCOUNTER — Ambulatory Visit: Payer: 59 | Admitting: Physician Assistant

## 2021-04-08 ENCOUNTER — Encounter: Payer: Self-pay | Admitting: Physician Assistant

## 2021-04-08 DIAGNOSIS — M79601 Pain in right arm: Secondary | ICD-10-CM | POA: Diagnosis not present

## 2021-04-08 NOTE — Progress Notes (Signed)
? ?Office Visit Note ?  ?Patient: Dominique Stevens           ?Date of Birth: 07-04-1975           ?MRN: [id number] ?Visit Date: 04/08/2021 ?             ?Requested by: No referring provider defined for this encounter. ?PCP: Pcp, No ? ?Chief Complaint  ?Patient presents with  ? Right Forearm - New Patient (Initial Visit)  ? ? ? ? ?HPI: ?Dominique Stevens is a pleasant 46 year old woman with a chief complaint of right forearm pain that radiates into her wrist with associated paresthesias.  She said she did take a fall onto her right side about 6 months ago.  She was seen and evaluated in another facility.  She did have x-rays taken and was told she did not have any fractures.  She did have some intermittent pain but recently began to have some numbing and tingling that radiates into her third fourth and fifth digits.  It is now significant enough that it is making it difficult for her to sleep.  She denies any really significant shoulder or neck pain.  She was seen in urgent care yesterday and she just began prednisone 20 mg daily. ? ?Assessment & Plan: ?Visit Diagnoses:  ?1. Right arm pain   ? ? ?Plan: Discussed the case with Dr. Tempie Donning.  We will go forward give her a sling for comfort though she is to come out of this so her shoulder does not get stiff.  We will get electrodiagnostic studies of her right arm.  In the meantime I am hoping that continued use of the steroid will help calm this down.  Follow-up with Dr. Tempie Donning once this is completed ? ?Follow-Up Instructions: No follow-ups on file.  ? ?Ortho Exam ? ?Patient is alert, oriented, no adenopathy, well-dressed, normal affect, normal respiratory effort. ?Examination her fingers are warm and pink with good capillary refill she has a good palpable pulse.  She does not have any significant swelling.  She has pain with range of motion of her ankle elbow that is reproduced in her fingers.  She has a negative Tinel's sign in the wrist but positive in the elbow.  She has no  crepitus.  She has pain in that shoots into the wrist with supination and pronation.  She favors not opposing her fingers because of the numbness and pain with the third fourth and fifth opposing to the thumb ? ?Imaging: ?No results found. ?No images are attached to the encounter. ? ?Labs: ?No results found for: HGBA1C, ESRSEDRATE, CRP, LABURIC, REPTSTATUS, GRAMSTAIN, CULT, LABORGA ? ? ?Lab Results  ?Component Value Date  ? ALBUMIN 4.0 01/23/2019  ? ALBUMIN 4.5 10/17/2017  ? ALBUMIN 4.9 03/01/2013  ? ? ?No results found for: MG ?No results found for: VD25OH ? ?No results found for: PREALBUMIN ? ?  Latest Ref Rng & Units 01/23/2019  ?  9:45 PM 10/17/2017  ? 11:53 AM 03/01/2013  ? 12:12 PM  ?CBC EXTENDED  ?WBC 4.0 - 10.5 K/uL 15.2   11.6   10.3    ?RBC 3.87 - 5.11 MIL/uL 4.34   4.81   5.00    ?Hemoglobin 12.0 - 15.0 g/dL 14.0   14.3   15.6    ?HCT 36.0 - 46.0 % 42.6   45.3   49.8    ?Platelets 150 - 400 K/uL 315   444     ?NEUT# 1.7 - 7.7 K/uL 9.3      ?  Lymph# 0.7 - 4.0 K/uL 4.7      ? ? ? ?There is no height or weight on file to calculate BMI. ? ?Orders:  ?Orders Placed This Encounter  ?Procedures  ? Ambulatory referral to Physical Medicine Rehab  ? ?No orders of the defined types were placed in this encounter. ? ? ? Procedures: ?No procedures performed ? ?Clinical Data: ?No additional findings. ? ?ROS: ? ?All other systems negative, except as noted in the HPI. ?Review of Systems ? ?Objective: ?Vital Signs: There were no vitals taken for this visit. ? ?Specialty Comments:  ?No specialty comments available. ? ?PMFS History: ?There are no problems to display for this patient. ? ?Past Medical History:  ?Diagnosis Date  ? Anxiety   ? Hypertension   ? Kidney stones   ?  ?Family History  ?Problem Relation Age of Onset  ? Hypertension Mother   ? Hypertension Maternal Grandmother   ? Heart disease Maternal Grandfather   ?  ?Past Surgical History:  ?Procedure Laterality Date  ? ABLATION    ? CHOLECYSTECTOMY    ? TUBAL LIGATION     ? ?Social History  ? ?Occupational History  ? Not on file  ?Tobacco Use  ? Smoking status: Every Day  ?  Packs/day: 0.50  ?  Years: 20.00  ?  Pack years: 10.00  ?  Types: Cigarettes  ? Smokeless tobacco: Current  ?Substance and Sexual Activity  ? Alcohol use: Yes  ? Drug use: No  ? Sexual activity: Not on file  ? ? ? ? ? ?

## 2021-09-25 ENCOUNTER — Encounter: Payer: Self-pay | Admitting: Internal Medicine

## 2021-09-25 ENCOUNTER — Other Ambulatory Visit (HOSPITAL_COMMUNITY)
Admission: RE | Admit: 2021-09-25 | Discharge: 2021-09-25 | Disposition: A | Discharge status: Home / Self Care | Payer: Commercial Managed Care - HMO | Source: Ambulatory Visit | Attending: Internal Medicine | Admitting: Internal Medicine

## 2021-09-25 ENCOUNTER — Ambulatory Visit (INDEPENDENT_AMBULATORY_CARE_PROVIDER_SITE_OTHER): Payer: Commercial Managed Care - HMO | Admitting: Internal Medicine

## 2021-09-25 VITALS — BP 138/96 | HR 113 | Ht 60.5 in | Wt 177.0 lb

## 2021-09-25 DIAGNOSIS — R030 Elevated blood-pressure reading, without diagnosis of hypertension: Secondary | ICD-10-CM | POA: Insufficient documentation

## 2021-09-25 DIAGNOSIS — Z720 Tobacco use: Secondary | ICD-10-CM | POA: Insufficient documentation

## 2021-09-25 DIAGNOSIS — F419 Anxiety disorder, unspecified: Secondary | ICD-10-CM | POA: Diagnosis not present

## 2021-09-25 DIAGNOSIS — Z0000 Encounter for general adult medical examination without abnormal findings: Secondary | ICD-10-CM

## 2021-09-25 DIAGNOSIS — Z124 Encounter for screening for malignant neoplasm of cervix: Secondary | ICD-10-CM | POA: Diagnosis present

## 2021-09-25 DIAGNOSIS — Z1231 Encounter for screening mammogram for malignant neoplasm of breast: Secondary | ICD-10-CM | POA: Diagnosis not present

## 2021-09-25 DIAGNOSIS — K219 Gastro-esophageal reflux disease without esophagitis: Secondary | ICD-10-CM | POA: Diagnosis not present

## 2021-09-25 DIAGNOSIS — R232 Flushing: Secondary | ICD-10-CM | POA: Diagnosis not present

## 2021-09-25 LAB — COMPREHENSIVE METABOLIC PANEL
ALT: 16 U/L (ref 0–35)
AST: 20 U/L (ref 0–37)
Albumin: 4.4 g/dL (ref 3.5–5.2)
Alkaline Phosphatase: 95 U/L (ref 39–117)
BUN: 14 mg/dL (ref 6–23)
CO2: 26 mEq/L (ref 19–32)
Calcium: 9.5 mg/dL (ref 8.4–10.5)
Chloride: 102 mEq/L (ref 96–112)
Creatinine, Ser: 0.77 mg/dL (ref 0.40–1.20)
GFR: 92.92 mL/min (ref >60.00)
Glucose, Bld: 74 mg/dL (ref 70–99)
Potassium: 4.1 mEq/L (ref 3.5–5.1)
Sodium: 137 mEq/L (ref 135–145)
Total Bilirubin: 0.6 mg/dL (ref 0.2–1.2)
Total Protein: 7.8 g/dL (ref 6.0–8.3)

## 2021-09-25 LAB — CBC
HCT: 44.5 % (ref 36.0–46.0)
Hemoglobin: 15.1 g/dL — ABNORMAL HIGH (ref 12.0–15.0)
MCHC: 33.9 g/dL (ref 30.0–36.0)
MCV: 94.4 fl (ref 78.0–100.0)
Platelets: 344 10*3/uL (ref 150.0–400.0)
RBC: 4.72 Mil/uL (ref 3.87–5.11)
RDW: 12.5 % (ref 11.5–15.5)
WBC: 9.4 10*3/uL (ref 4.0–10.5)

## 2021-09-25 LAB — LIPID PANEL
Cholesterol: 202 mg/dL — ABNORMAL HIGH (ref 0–200)
HDL: 44.1 mg/dL (ref >39.00)
LDL Cholesterol: 130 mg/dL — ABNORMAL HIGH (ref 0–99)
NonHDL: 157.49
Total CHOL/HDL Ratio: 5
Triglycerides: 138 mg/dL (ref 0.0–149.0)
VLDL: 27.6 mg/dL (ref 0.0–40.0)

## 2021-09-25 LAB — FOLLICLE STIMULATING HORMONE: FSH: 52.4 m[IU]/mL

## 2021-09-25 MED ORDER — ESCITALOPRAM OXALATE 10 MG PO TABS: 10.0000 mg | ORAL_TABLET | Freq: Every day | ORAL | 3 refills | Status: DC

## 2021-09-25 MED ORDER — ESOMEPRAZOLE MAGNESIUM 40 MG PO CPDR: 40.0000 mg | DELAYED_RELEASE_CAPSULE | Freq: Every day | ORAL | 3 refills | Status: DC

## 2021-09-25 MED ORDER — PANTOPRAZOLE SODIUM 40 MG PO TBEC: 40.0000 mg | DELAYED_RELEASE_TABLET | Freq: Every day | ORAL | 3 refills | Status: DC

## 2021-09-25 MED ORDER — ALPRAZOLAM 0.25 MG PO TABS: 0.2500 mg | ORAL_TABLET | Freq: Two times a day (BID) | ORAL | 0 refills | Status: DC | PRN

## 2021-09-25 NOTE — Assessment & Plan Note (Signed)
Moderate to severe due to life circumstances. Rx lexapro 5 mg daily 1 week then increase to 10 mg daily. Rx alprazolam 0.25 mg for as needed only for panic episodes. She will let us know in 3-4 weeks how this is going.

## 2021-09-25 NOTE — Assessment & Plan Note (Signed)
OTC omeprazole not helping. Will change to nexium 40 mg daily and if no improvement should have referral to GI. Rx done today for nexium.

## 2021-09-25 NOTE — Patient Instructions (Addendum)
We have sent in lexapro to take 1/2 pill daily for first week then go up to 1 pill daily. Let us know in 1 month how you are doing.  We have done the EKG which is normal.   We have done the pap smear which should be back in 2-3 days.   We have sent in nexium (esomeprazole) to take daily instead of omeprazole for the stomach pain.  We are also checking labs and will let you know about these.

## 2021-09-25 NOTE — Assessment & Plan Note (Signed)
BP elevated. EKG done without changes. Suspect due to anxiety she has not had healthcare in 10+ years. There is family history so we are treating anxiety and asked her to return in 3-6 months for recheck.

## 2021-09-25 NOTE — Assessment & Plan Note (Signed)
Advised to quit smoking and she is unable at this time. Reminded about risk and harm of smoking.

## 2021-09-25 NOTE — Assessment & Plan Note (Signed)
Flu shot counseled. Tetanus counseled. Colonoscopy counseled will think about it. Mammogram ordered, pap smear collected. Counseled about sun safety and mole surveillance. Counseled about the dangers of distracted driving. Given 10 year screening recommendations.

## 2021-09-25 NOTE — Progress Notes (Signed)
   Subjective:   Patient ID: Dominique Stevens, female    DOB: July 12, 1975, 46 y.o.   MRN: [id number]  HPI The patient is a new 46 YO female coming in for concerns and care desires physical  Review of Systems  Constitutional:  Positive for activity change.  HENT: Negative.    Eyes: Negative.   Respiratory:  Negative for cough, chest tightness and shortness of breath.   Cardiovascular:  Negative for chest pain, palpitations and leg swelling.  Gastrointestinal:  Negative for abdominal distention, abdominal pain, constipation, diarrhea, nausea and vomiting.  Musculoskeletal: Negative.   Skin: Negative.   Neurological: Negative.   Psychiatric/Behavioral:  Positive for decreased concentration and dysphoric mood. The patient is nervous/anxious.     Objective:  Physical Exam Exam conducted with a chaperone present.  Constitutional:      Appearance: She is well-developed.  HENT:     Head: Normocephalic and atraumatic.  Cardiovascular:     Rate and Rhythm: Normal rate and regular rhythm.  Pulmonary:     Effort: Pulmonary effort is normal. No respiratory distress.     Breath sounds: Normal breath sounds. No wheezing or rales.  Abdominal:     General: Bowel sounds are normal. There is no distension.     Palpations: Abdomen is soft.     Tenderness: There is no abdominal tenderness. There is no rebound.  Genitourinary:    Comments: Pap smear collected Musculoskeletal:     Cervical back: Normal range of motion.  Skin:    General: Skin is warm and dry.  Neurological:     Mental Status: She is alert and oriented to person, place, and time.     Coordination: Coordination normal.     Vitals:   09/25/21 0803 09/25/21 0809  BP: (!) 142/92 (!) 138/96  Pulse: (!) 113   SpO2: 98%   Weight: 177 lb (80.3 kg)   Height: 5' 0.5" (1.537 m)    EKG: Rate 92, axis normal, interval normal, sinus, no st or t wave changes, no significant change compared to prior 2019   Assessment & Plan:

## 2021-09-25 NOTE — Assessment & Plan Note (Signed)
Checking [place] as she had ablation does not have periods so unsure if her symptoms are related to menopause.

## 2021-10-02 LAB — CYTOLOGY - PAP
Adequacy: ABSENT
Comment: NEGATIVE
Diagnosis: NEGATIVE
Diagnosis: REACTIVE
High risk HPV: NEGATIVE

## 2021-10-06 ENCOUNTER — Other Ambulatory Visit: Payer: Self-pay | Admitting: Internal Medicine

## 2021-10-07 ENCOUNTER — Ambulatory Visit
Admission: RE | Admit: 2021-10-07 | Discharge: 2021-10-07 | Disposition: A | Discharge status: Home / Self Care | Payer: Commercial Managed Care - HMO | Source: Ambulatory Visit | Attending: Internal Medicine | Admitting: Internal Medicine

## 2021-10-07 ENCOUNTER — Other Ambulatory Visit: Payer: Self-pay | Admitting: Internal Medicine

## 2021-10-07 DIAGNOSIS — Z1231 Encounter for screening mammogram for malignant neoplasm of breast: Secondary | ICD-10-CM

## 2021-10-07 MED ORDER — ALPRAZOLAM 0.25 MG PO TABS: 0.2500 mg | ORAL_TABLET | Freq: Two times a day (BID) | ORAL | 0 refills | Status: DC | PRN

## 2021-10-09 ENCOUNTER — Other Ambulatory Visit: Payer: Self-pay | Admitting: Internal Medicine

## 2021-10-09 DIAGNOSIS — R928 Other abnormal and inconclusive findings on diagnostic imaging of breast: Secondary | ICD-10-CM

## 2021-10-14 ENCOUNTER — Ambulatory Visit
Admission: RE | Admit: 2021-10-14 | Discharge: 2021-10-14 | Disposition: A | Discharge status: Home / Self Care | Payer: Commercial Managed Care - HMO | Source: Ambulatory Visit | Attending: Internal Medicine | Admitting: Internal Medicine

## 2021-10-14 ENCOUNTER — Other Ambulatory Visit: Payer: Self-pay | Admitting: Internal Medicine

## 2021-10-14 DIAGNOSIS — N6310 Unspecified lump in the right breast, unspecified quadrant: Secondary | ICD-10-CM

## 2021-10-14 DIAGNOSIS — R928 Other abnormal and inconclusive findings on diagnostic imaging of breast: Secondary | ICD-10-CM

## 2021-10-15 ENCOUNTER — Encounter: Payer: Self-pay | Admitting: Internal Medicine

## 2021-10-15 ENCOUNTER — Telehealth: Payer: Self-pay

## 2021-10-15 NOTE — Telephone Encounter (Signed)
Please advise 

## 2021-10-15 NOTE — Telephone Encounter (Signed)
She can use the anxiety medicine let me know if she needs refill. Try deep breathing, meditation, exercise to help naturally relieve anxiety.

## 2021-10-15 NOTE — Telephone Encounter (Signed)
Notified pt regarding anxiety medications and instruction per Dr. Sharlet Salina.

## 2021-10-15 NOTE — Telephone Encounter (Signed)
Patient is calling in stating she recently seen Dr.Crawford to get anxiety medication, states it has been helping but recently got the news that she will need a biopsy on her breast. Since getting the news her anxiety has been out of control, she is shaking, feeling very upset. Wants to know what Dr.Crawford recommends her to do as the appointment is next week.

## 2021-10-16 ENCOUNTER — Other Ambulatory Visit: Payer: Self-pay | Admitting: Internal Medicine

## 2021-10-16 ENCOUNTER — Telehealth: Payer: Self-pay | Admitting: Internal Medicine

## 2021-10-16 DIAGNOSIS — F419 Anxiety disorder, unspecified: Secondary | ICD-10-CM

## 2021-10-16 MED ORDER — ALPRAZOLAM 0.25 MG PO TABS: 0.2500 mg | ORAL_TABLET | Freq: Two times a day (BID) | ORAL | 0 refills | Status: DC | PRN

## 2021-10-16 NOTE — Telephone Encounter (Signed)
Pt stated --took last tab for the Xanax and needed more refills. Please advise

## 2021-10-16 NOTE — Telephone Encounter (Signed)
Patient is calling in stating that [facility] does not have a refill on file for the patient, wondered if we can re send this to them. Dicy said they advised that we arent clearly reading the note they are sending.

## 2021-10-21 ENCOUNTER — Ambulatory Visit
Admission: RE | Admit: 2021-10-21 | Discharge: 2021-10-21 | Disposition: A | Discharge status: Home / Self Care | Payer: Commercial Managed Care - HMO | Source: Ambulatory Visit | Attending: Internal Medicine | Admitting: Internal Medicine

## 2021-10-21 DIAGNOSIS — N6310 Unspecified lump in the right breast, unspecified quadrant: Secondary | ICD-10-CM

## 2021-10-21 HISTORY — PX: BREAST BIOPSY: SHX20

## 2021-10-31 ENCOUNTER — Telehealth: Payer: Self-pay | Admitting: Internal Medicine

## 2021-10-31 DIAGNOSIS — F419 Anxiety disorder, unspecified: Secondary | ICD-10-CM

## 2021-10-31 NOTE — Telephone Encounter (Signed)
Pt checking status of RX from Dr. Sharlet Salina

## 2021-10-31 NOTE — Telephone Encounter (Signed)
Patient called needing this rx called in - patient wants to know if you can call a 30 day supply in.  Please advise  Next visit:  12/24/2021  Last:  09/25/2021  [facility] [place] [address]

## 2021-11-03 MED ORDER — ALPRAZOLAM 0.25 MG PO TABS: 0.2500 mg | ORAL_TABLET | Freq: Two times a day (BID) | ORAL | 2 refills | Status: DC | PRN

## 2021-11-03 NOTE — Addendum Note (Signed)
Addended by: Pricilla Holm A on: 11/03/2021 10:14 AM   Modules accepted: Orders

## 2021-11-03 NOTE — Telephone Encounter (Signed)
Pt requesting ALPRAZolam (XANAX) 0.25 MG tablet- #30 days supply-send to [facility] [place]

## 2021-11-03 NOTE — Telephone Encounter (Signed)
Patient states that she did not hear back about her medication - she is requesting a call as soon as possible - [phone]

## 2021-11-05 ENCOUNTER — Ambulatory Visit: Payer: Self-pay | Admitting: General Surgery

## 2021-11-05 DIAGNOSIS — D241 Benign neoplasm of right breast: Secondary | ICD-10-CM

## 2021-11-10 ENCOUNTER — Other Ambulatory Visit: Payer: Self-pay | Admitting: General Surgery

## 2021-11-10 DIAGNOSIS — D241 Benign neoplasm of right breast: Secondary | ICD-10-CM

## 2021-11-26 ENCOUNTER — Encounter (HOSPITAL_BASED_OUTPATIENT_CLINIC_OR_DEPARTMENT_OTHER): Payer: Self-pay | Admitting: General Surgery

## 2021-11-26 ENCOUNTER — Other Ambulatory Visit: Payer: Self-pay

## 2021-12-04 ENCOUNTER — Ambulatory Visit
Admission: RE | Admit: 2021-12-04 | Discharge: 2021-12-04 | Disposition: A | Discharge status: Home / Self Care | Payer: Commercial Managed Care - HMO | Source: Ambulatory Visit | Attending: General Surgery | Admitting: General Surgery

## 2021-12-04 DIAGNOSIS — D241 Benign neoplasm of right breast: Secondary | ICD-10-CM

## 2021-12-04 HISTORY — PX: BREAST BIOPSY: SHX20

## 2021-12-04 NOTE — Progress Notes (Signed)

## 2021-12-05 ENCOUNTER — Encounter (HOSPITAL_BASED_OUTPATIENT_CLINIC_OR_DEPARTMENT_OTHER)
Admission: RE | Disposition: A | Discharge status: Home / Self Care | Payer: Self-pay | Source: Home / Self Care | Attending: General Surgery

## 2021-12-05 ENCOUNTER — Ambulatory Visit
Admission: RE | Admit: 2021-12-05 | Discharge: 2021-12-05 | Disposition: A | Discharge status: Home / Self Care | Payer: Commercial Managed Care - HMO | Source: Ambulatory Visit | Attending: General Surgery | Admitting: General Surgery

## 2021-12-05 ENCOUNTER — Ambulatory Visit (HOSPITAL_BASED_OUTPATIENT_CLINIC_OR_DEPARTMENT_OTHER)
Admission: RE | Admit: 2021-12-05 | Discharge: 2021-12-05 | Disposition: A | Discharge status: Home / Self Care | Payer: Commercial Managed Care - HMO | Attending: General Surgery | Admitting: General Surgery

## 2021-12-05 ENCOUNTER — Ambulatory Visit (HOSPITAL_BASED_OUTPATIENT_CLINIC_OR_DEPARTMENT_OTHER): Payer: Commercial Managed Care - HMO | Admitting: Anesthesiology

## 2021-12-05 ENCOUNTER — Other Ambulatory Visit: Payer: Self-pay

## 2021-12-05 DIAGNOSIS — K219 Gastro-esophageal reflux disease without esophagitis: Secondary | ICD-10-CM | POA: Insufficient documentation

## 2021-12-05 DIAGNOSIS — F419 Anxiety disorder, unspecified: Secondary | ICD-10-CM | POA: Insufficient documentation

## 2021-12-05 DIAGNOSIS — D241 Benign neoplasm of right breast: Secondary | ICD-10-CM

## 2021-12-05 DIAGNOSIS — F17210 Nicotine dependence, cigarettes, uncomplicated: Secondary | ICD-10-CM | POA: Diagnosis not present

## 2021-12-05 DIAGNOSIS — Z01818 Encounter for other preprocedural examination: Secondary | ICD-10-CM

## 2021-12-05 HISTORY — PX: BREAST LUMPECTOMY WITH RADIOACTIVE SEED LOCALIZATION: SHX6424

## 2021-12-05 LAB — POCT PREGNANCY, URINE: Preg Test, Ur: NEGATIVE

## 2021-12-05 SURGERY — BREAST LUMPECTOMY WITH RADIOACTIVE SEED LOCALIZATION
Anesthesia: General | Site: Breast | Laterality: Right

## 2021-12-05 MED ORDER — EPHEDRINE 5 MG/ML INJ
INTRAVENOUS | Status: AC
  Filled 2021-12-05: qty 5

## 2021-12-05 MED ORDER — AMISULPRIDE (ANTIEMETIC) 5 MG/2ML IV SOLN
10.0000 mg | Freq: Once | INTRAVENOUS | Status: AC
  Administered 2021-12-05: 10 mg via INTRAVENOUS

## 2021-12-05 MED ORDER — FENTANYL CITRATE (PF) 100 MCG/2ML IJ SOLN
25.0000 ug | INTRAMUSCULAR | Status: DC | PRN
  Administered 2021-12-05 (×3): 50 ug via INTRAVENOUS

## 2021-12-05 MED ORDER — CELECOXIB 200 MG PO CAPS
ORAL_CAPSULE | ORAL | Status: AC
  Filled 2021-12-05: qty 1

## 2021-12-05 MED ORDER — OXYCODONE HCL 5 MG/5ML PO SOLN: 5.0000 mg | Freq: Once | ORAL | Status: AC | PRN

## 2021-12-05 MED ORDER — CHLORHEXIDINE GLUCONATE CLOTH 2 % EX PADS: 6.0000 | MEDICATED_PAD | Freq: Once | CUTANEOUS | Status: DC

## 2021-12-05 MED ORDER — PROPOFOL 10 MG/ML IV BOLUS
INTRAVENOUS | Status: DC | PRN
  Administered 2021-12-05: 200 mg via INTRAVENOUS

## 2021-12-05 MED ORDER — OXYCODONE HCL 5 MG PO TABS: 5.0000 mg | ORAL_TABLET | Freq: Four times a day (QID) | ORAL | 0 refills | Status: DC | PRN

## 2021-12-05 MED ORDER — OXYCODONE HCL 5 MG PO TABS
5.0000 mg | ORAL_TABLET | Freq: Once | ORAL | Status: AC | PRN
  Administered 2021-12-05: 5 mg via ORAL

## 2021-12-05 MED ORDER — DEXAMETHASONE SODIUM PHOSPHATE 4 MG/ML IJ SOLN
INTRAMUSCULAR | Status: DC | PRN
  Administered 2021-12-05: 5 mg via INTRAVENOUS

## 2021-12-05 MED ORDER — GABAPENTIN 300 MG PO CAPS
300.0000 mg | ORAL_CAPSULE | ORAL | Status: AC
  Administered 2021-12-05: 300 mg via ORAL

## 2021-12-05 MED ORDER — PHENYLEPHRINE 80 MCG/ML (10ML) SYRINGE FOR IV PUSH (FOR BLOOD PRESSURE SUPPORT)
PREFILLED_SYRINGE | INTRAVENOUS | Status: AC
  Filled 2021-12-05: qty 10

## 2021-12-05 MED ORDER — FENTANYL CITRATE (PF) 100 MCG/2ML IJ SOLN
INTRAMUSCULAR | Status: AC
  Filled 2021-12-05: qty 2

## 2021-12-05 MED ORDER — LACTATED RINGERS IV SOLN: INTRAVENOUS | Status: DC

## 2021-12-05 MED ORDER — CELECOXIB 200 MG PO CAPS
200.0000 mg | ORAL_CAPSULE | ORAL | Status: AC
  Administered 2021-12-05: 200 mg via ORAL

## 2021-12-05 MED ORDER — OXYCODONE HCL 5 MG PO TABS
ORAL_TABLET | ORAL | Status: AC
  Filled 2021-12-05: qty 1

## 2021-12-05 MED ORDER — LIDOCAINE HCL (CARDIAC) PF 100 MG/5ML IV SOSY
PREFILLED_SYRINGE | INTRAVENOUS | Status: DC | PRN
  Administered 2021-12-05: 60 mg via INTRAVENOUS

## 2021-12-05 MED ORDER — CEFAZOLIN SODIUM-DEXTROSE 2-4 GM/100ML-% IV SOLN
2.0000 g | INTRAVENOUS | Status: AC
  Administered 2021-12-05: 2 g via INTRAVENOUS

## 2021-12-05 MED ORDER — PROMETHAZINE HCL 25 MG/ML IJ SOLN
INTRAMUSCULAR | Status: AC
  Filled 2021-12-05: qty 1

## 2021-12-05 MED ORDER — PROPOFOL 500 MG/50ML IV EMUL
INTRAVENOUS | Status: DC | PRN
  Administered 2021-12-05: 200 ug/kg/min via INTRAVENOUS

## 2021-12-05 MED ORDER — EPHEDRINE SULFATE (PRESSORS) 50 MG/ML IJ SOLN
INTRAMUSCULAR | Status: DC | PRN
  Administered 2021-12-05: 10 mg via INTRAVENOUS

## 2021-12-05 MED ORDER — GABAPENTIN 300 MG PO CAPS
ORAL_CAPSULE | ORAL | Status: AC
  Filled 2021-12-05: qty 1

## 2021-12-05 MED ORDER — PROMETHAZINE HCL 25 MG/ML IJ SOLN
6.2500 mg | INTRAMUSCULAR | Status: DC | PRN
  Administered 2021-12-05: 12.5 mg via INTRAVENOUS

## 2021-12-05 MED ORDER — ACETAMINOPHEN 500 MG PO TABS
ORAL_TABLET | ORAL | Status: AC
  Filled 2021-12-05: qty 2

## 2021-12-05 MED ORDER — ACETAMINOPHEN 500 MG PO TABS
1000.0000 mg | ORAL_TABLET | ORAL | Status: AC
  Administered 2021-12-05: 1000 mg via ORAL

## 2021-12-05 MED ORDER — SUCCINYLCHOLINE CHLORIDE 200 MG/10ML IV SOSY
PREFILLED_SYRINGE | INTRAVENOUS | Status: AC
  Filled 2021-12-05: qty 10

## 2021-12-05 MED ORDER — FENTANYL CITRATE (PF) 100 MCG/2ML IJ SOLN
INTRAMUSCULAR | Status: DC | PRN
  Administered 2021-12-05: 100 ug via INTRAVENOUS

## 2021-12-05 MED ORDER — ONDANSETRON HCL 4 MG/2ML IJ SOLN
INTRAMUSCULAR | Status: AC
  Filled 2021-12-05: qty 2

## 2021-12-05 MED ORDER — MIDAZOLAM HCL 5 MG/5ML IJ SOLN
INTRAMUSCULAR | Status: DC | PRN
  Administered 2021-12-05: 2 mg via INTRAVENOUS

## 2021-12-05 MED ORDER — LIDOCAINE 2% (20 MG/ML) 5 ML SYRINGE
INTRAMUSCULAR | Status: AC
  Filled 2021-12-05: qty 5

## 2021-12-05 MED ORDER — CEFAZOLIN SODIUM-DEXTROSE 2-4 GM/100ML-% IV SOLN
INTRAVENOUS | Status: AC
  Filled 2021-12-05: qty 100

## 2021-12-05 MED ORDER — MIDAZOLAM HCL 2 MG/2ML IJ SOLN
INTRAMUSCULAR | Status: AC
  Filled 2021-12-05: qty 2

## 2021-12-05 MED ORDER — BUPIVACAINE-EPINEPHRINE (PF) 0.25% -1:200000 IJ SOLN
INTRAMUSCULAR | Status: DC | PRN
  Administered 2021-12-05: 20 mL

## 2021-12-05 MED ORDER — ONDANSETRON HCL 4 MG/2ML IJ SOLN
INTRAMUSCULAR | Status: DC | PRN
  Administered 2021-12-05: 4 mg via INTRAVENOUS

## 2021-12-05 MED ORDER — DEXAMETHASONE SODIUM PHOSPHATE 10 MG/ML IJ SOLN
INTRAMUSCULAR | Status: AC
  Filled 2021-12-05: qty 1

## 2021-12-05 MED ORDER — ATROPINE SULFATE 0.4 MG/ML IV SOLN
INTRAVENOUS | Status: AC
  Filled 2021-12-05: qty 1

## 2021-12-05 MED ORDER — AMISULPRIDE (ANTIEMETIC) 5 MG/2ML IV SOLN
INTRAVENOUS | Status: AC
  Filled 2021-12-05: qty 4

## 2021-12-05 SURGICAL SUPPLY — 41 items
ADH SKN CLS APL DERMABOND .7 (GAUZE/BANDAGES/DRESSINGS) ×1
APL PRP STRL LF DISP 70% ISPRP (MISCELLANEOUS) ×1
APPLIER CLIP 9.375 MED OPEN (MISCELLANEOUS)
APR CLP MED 9.3 20 MLT OPN (MISCELLANEOUS)
BLADE SURG 15 STRL LF DISP TIS (BLADE) ×1 IMPLANT
BLADE SURG 15 STRL SS (BLADE) ×1
CANISTER SUC SOCK COL 7IN (MISCELLANEOUS) ×1 IMPLANT
CANISTER SUCT 1200ML W/VALVE (MISCELLANEOUS) ×1 IMPLANT
CHLORAPREP W/TINT 26 (MISCELLANEOUS) ×1 IMPLANT
CLIP APPLIE 9.375 MED OPEN (MISCELLANEOUS) IMPLANT
COVER BACK TABLE 60X90IN (DRAPES) ×1 IMPLANT
COVER MAYO STAND STRL (DRAPES) ×1 IMPLANT
COVER PROBE CYLINDRICAL 5X96 (MISCELLANEOUS) ×1 IMPLANT
DERMABOND ADVANCED .7 DNX12 (GAUZE/BANDAGES/DRESSINGS) ×1 IMPLANT
DRAPE LAPAROSCOPIC ABDOMINAL (DRAPES) ×1 IMPLANT
DRAPE UTILITY XL STRL (DRAPES) ×1 IMPLANT
ELECT COATED BLADE 2.86 ST (ELECTRODE) ×1 IMPLANT
ELECT REM PT RETURN 9FT ADLT (ELECTROSURGICAL) ×1
ELECTRODE REM PT RTRN 9FT ADLT (ELECTROSURGICAL) ×1 IMPLANT
GLOVE BIO SURGEON STRL SZ7.5 (GLOVE) ×2 IMPLANT
GOWN STRL REUS W/ TWL LRG LVL3 (GOWN DISPOSABLE) ×2 IMPLANT
GOWN STRL REUS W/TWL LRG LVL3 (GOWN DISPOSABLE) ×3
ILLUMINATOR WAVEGUIDE N/F (MISCELLANEOUS) IMPLANT
KIT MARKER MARGIN INK (KITS) ×1 IMPLANT
LIGHT WAVEGUIDE WIDE FLAT (MISCELLANEOUS) IMPLANT
NDL HYPO 25X1 1.5 SAFETY (NEEDLE) IMPLANT
NEEDLE HYPO 25X1 1.5 SAFETY (NEEDLE) ×1 IMPLANT
NS IRRIG 1000ML POUR BTL (IV SOLUTION) IMPLANT
PACK BASIN DAY SURGERY FS (CUSTOM PROCEDURE TRAY) ×1 IMPLANT
PENCIL SMOKE EVACUATOR (MISCELLANEOUS) ×1 IMPLANT
SLEEVE SCD COMPRESS KNEE MED (STOCKING) ×1 IMPLANT
SPIKE FLUID TRANSFER (MISCELLANEOUS) IMPLANT
SPONGE T-LAP 18X18 ~~LOC~~+RFID (SPONGE) ×1 IMPLANT
SUT MON AB 4-0 PC3 18 (SUTURE) ×1 IMPLANT
SUT SILK 2 0 SH (SUTURE) IMPLANT
SUT VICRYL 3-0 CR8 SH (SUTURE) ×1 IMPLANT
SYR CONTROL 10ML LL (SYRINGE) IMPLANT
TOWEL GREEN STERILE FF (TOWEL DISPOSABLE) ×1 IMPLANT
TRAY FAXITRON CT DISP (TRAY / TRAY PROCEDURE) ×1 IMPLANT
TUBE CONNECTING 20X1/4 (TUBING) ×1 IMPLANT
YANKAUER SUCT BULB TIP NO VENT (SUCTIONS) IMPLANT

## 2021-12-05 NOTE — Discharge Instructions (Signed)
  Post Anesthesia Home Care Instructions  Activity: Get plenty of rest for the remainder of the day. A responsible individual must stay with you for 24 hours following the procedure.  For the next 24 hours, DO NOT: -Drive a car -Paediatric nurse -Drink alcoholic beverages -Take any medication unless instructed by your physician -Make any legal decisions or sign important papers.  Meals: Start with liquid foods such as gelatin or soup. Progress to regular foods as tolerated. Avoid greasy, spicy, heavy foods. If nausea and/or vomiting occur, drink only clear liquids until the nausea and/or vomiting subsides. Call your physician if vomiting continues.  Special Instructions/Symptoms: Your throat may feel dry or sore from the anesthesia or the breathing tube placed in your throat during surgery. If this causes discomfort, gargle with warm salt water. The discomfort should disappear within 24 hours.  If you had a scopolamine patch placed behind your ear for the management of post- operative nausea and/or vomiting:  1. The medication in the patch is effective for 72 hours, after which it should be removed.  Wrap patch in a tissue and discard in the trash. Wash hands thoroughly with soap and water. 2. You may remove the patch earlier than 72 hours if you experience unpleasant side effects which may include dry mouth, dizziness or visual disturbances. 3. Avoid touching the patch. Wash your hands with soap and water after contact with the patch.     No Tylenol or Ibuprofen until after 1:45pm today.

## 2021-12-05 NOTE — Interval H&P Note (Signed)
History and Physical Interval Note:  12/05/2021 8:56 AM  Dominique Stevens  has presented today for surgery, with the diagnosis of RIGHT BREAST FIBROADENOMA.  The various methods of treatment have been discussed with the patient and family. After consideration of risks, benefits and other options for treatment, the patient has consented to  Procedure(s): RIGHT BREAST LUMPECTOMY WITH RADIOACTIVE SEED LOCALIZATION (Right) as a surgical intervention.  The patient's history has been reviewed, patient examined, no change in status, stable for surgery.  I have reviewed the patient's chart and labs.  Questions were answered to the patient's satisfaction.     Autumn Messing III

## 2021-12-05 NOTE — Anesthesia Procedure Notes (Signed)
Procedure Name: LMA Insertion Date/Time: 12/05/2021 9:14 AM  Performed by: Willa Frater, CRNAPre-anesthesia Checklist: Patient identified, Emergency Drugs available, Suction available and Patient being monitored Patient Re-evaluated:Patient Re-evaluated prior to induction Oxygen Delivery Method: Circle system utilized Preoxygenation: Pre-oxygenation with 100% oxygen Induction Type: IV induction Ventilation: Mask ventilation without difficulty LMA: LMA inserted LMA Size: 4.0 Number of attempts: 1 Airway Equipment and Method: Bite block Placement Confirmation: positive ETCO2 Tube secured with: Tape Dental Injury: Teeth and Oropharynx as per pre-operative assessment

## 2021-12-05 NOTE — Op Note (Signed)
12/05/2021  10:03 AM  PATIENT:  Dominique Stevens  46 y.o. female  PRE-OPERATIVE DIAGNOSIS:  RIGHT BREAST FIBROADENOMA  POST-OPERATIVE DIAGNOSIS:  RIGHT BREAST FIBROADENOMA  PROCEDURE:  Procedure(s): RIGHT BREAST LUMPECTOMY WITH RADIOACTIVE SEED LOCALIZATION (Right)  SURGEON:  Surgeon(s) and Role:    * Jovita Kussmaul, MD - Primary  PHYSICIAN ASSISTANT:   ASSISTANTS: Dr. Mariel Aloe   ANESTHESIA:   local and general  EBL:  5 mL   BLOOD ADMINISTERED:none  DRAINS: none   LOCAL MEDICATIONS USED:  MARCAINE     SPECIMEN:  Source of Specimen:  right breast tissue  DISPOSITION OF SPECIMEN:  PATHOLOGY  COUNTS:  YES  TOURNIQUET:  * No tourniquets in log *  DICTATION: .Dragon Dictation  After informed consent was obtained the patient was brought to the operating room and placed in the supine position on the operating table.  After adequate induction of general anesthesia the patient's right breast was prepped with ChloraPrep, allowed to dry, and draped in usual sterile manner.  An appropriate timeout was performed.  Previously an I-125 seed was placed in the upper portion of the right breast to mark an area of fibroadenoma.  The neoprobe was set to I-125 in the area of radioactivity was readily identified.  The area around this was infiltrated with quarter percent Marcaine.  A curvilinear incision was made along the upper edge of the areola of the right breast with a 15 blade knife.  The incision was carried through the skin and subcutaneous tissue sharply with the electrocautery.  Dissection was then carried towards the radioactive seed under the direction of the neoprobe.  Once I more closely approached the radioactive seed I then removed a circular portion of breast tissue sharply with the electrocautery around the radioactive seed while checking the area of radioactivity frequently.  Once the specimen was removed it was oriented with the appropriate paint colors.  A specimen radiograph was  obtained that showed the clip and seed to be within the specimen.  The specimen was then sent to pathology for further evaluation.  Hemostasis was achieved using the Bovie electrocautery.  The deep layer of the incision was then closed with layers of interrupted 3-0 Vicryl stitches.  The skin was then closed with interrupted 4-0 Monocryl subcuticular stitches.  Dermabond dressings were applied.  The patient tolerated the procedure well.  At the end of the case all needle sponge and instrument counts were correct.  The patient was then awakened and taken to recovery in stable condition.I was personally present during the key and critical portions of this procedure and immediately available throughout the entire procedure, as documented in my operative note.   PLAN OF CARE: Discharge to home after PACU  PATIENT DISPOSITION:  PACU - hemodynamically stable.   Delay start of Pharmacological VTE agent (>24hrs) due to surgical blood loss or risk of bleeding: not applicable

## 2021-12-05 NOTE — Anesthesia Preprocedure Evaluation (Addendum)
Anesthesia Evaluation  Patient identified by MRN, date of birth, ID band Patient awake    Reviewed: Allergy & Precautions, NPO status , Patient's Chart, lab work & pertinent test results  History of Anesthesia Complications Negative for: history of anesthetic complications  Airway Mallampati: II  TM Distance: >3 FB Neck ROM: Full    Dental  (+) Dental Advisory Given, Upper Dentures   Pulmonary Current SmokerPatient did not abstain from smoking.   Pulmonary exam normal        Cardiovascular hypertension, Normal cardiovascular exam     Neuro/Psych  PSYCHIATRIC DISORDERS Anxiety     negative neurological ROS     GI/Hepatic Neg liver ROS,GERD  Medicated and Controlled,,  Endo/Other  negative endocrine ROS    Renal/GU negative Renal ROS     Musculoskeletal negative musculoskeletal ROS (+)    Abdominal   Peds  Hematology negative hematology ROS (+)   Anesthesia Other Findings   Reproductive/Obstetrics  s/p tubal ligation  RIGHT BREAST FIBROADENOMA                              Anesthesia Physical Anesthesia Plan  ASA: 2  Anesthesia Plan: General   Post-op Pain Management: Tylenol PO (pre-op)* and Celebrex PO (pre-op)*   Induction: Intravenous  PONV Risk Score and Plan: 2 and Treatment may vary due to age or medical condition, Ondansetron, Dexamethasone and Midazolam  Airway Management Planned: LMA  Additional Equipment: None  Intra-op Plan:   Post-operative Plan: Extubation in OR  Informed Consent: I have reviewed the patients History and Physical, chart, labs and discussed the procedure including the risks, benefits and alternatives for the proposed anesthesia with the patient or authorized representative who has indicated his/her understanding and acceptance.     Dental advisory given  Plan Discussed with: CRNA and Anesthesiologist  Anesthesia Plan Comments:         Anesthesia Quick Evaluation

## 2021-12-05 NOTE — H&P (Signed)
REFERRING PHYSICIAN: Charna Busman*  PROVIDER: Landry Corporal, MD  MRN: [id number] DOB: 03-08-1975 Subjective  Chief Complaint: New Consultation and Breast Problem   History of Present Illness: Dominique Stevens is a 46 y.o. female who is seen today as an office consultation for evaluation of New Consultation and Breast Problem .  We are asked to see the patient in consultation by Dr. Pricilla Holm to evaluate her for a fibroadenoma in the right breast. The patient is a 46 year old white female who initially felt something painful in the upper portion of the right breast about 6 months ago. She has not had a mammogram in about 11 years. She brought this to her doctor's attention and was evaluated with mammogram and ultrasound. She was found to have a 1.4 cm mass in the upper portion of the right breast. This was biopsied and came back as a benign fibroadenoma. She has no family history of breast cancer. She is otherwise in pretty good health. She does smoke about a half a pack of cigarettes a day.  Review of Systems: A complete review of systems was obtained from the patient. I have reviewed this information and discussed as appropriate with the patient. See HPI as well for other ROS.  Review of Systems Constitutional: Negative. HENT: Negative. Eyes: Negative. Respiratory: Negative. Cardiovascular: Negative. Gastrointestinal: Negative. Genitourinary: Negative. Musculoskeletal: Negative. Skin: Negative. Neurological: Negative. Endo/Heme/Allergies: Negative. Psychiatric/Behavioral: Negative.   Medical History: Past Medical History: Diagnosis Date Anxiety  Patient Active Problem List Diagnosis Fibroadenoma of right breast  Past Surgical History: Procedure Laterality Date LAPAROSCOPIC CHOLECYSTECTOMY tube tides uterus ablation   No Known Allergies  Current Outpatient Medications on File Prior to Visit Medication Sig Dispense Refill ALPRAZolam (XANAX) 0.25  MG tablet Take 0.25 mg by mouth 2 (two) times daily as needed escitalopram oxalate (LEXAPRO) 10 MG tablet Take 10 mg by mouth once daily esomeprazole (NEXIUM) 40 MG DR capsule TAKE 1 CAPSULE (40 MG TOTAL) BY MOUTH DAILY AT 12 NOON.  No current facility-administered medications on file prior to visit.  Family History Problem Relation Age of Onset High blood pressure (Hypertension) Mother Diabetes Father   Social History  Tobacco Use Smoking Status Every Day Packs/day: .5 Types: Cigarettes Smokeless Tobacco Never   Social History  Socioeconomic History Marital status: Married Tobacco Use Smoking status: Every Day Packs/day: .5 Types: Cigarettes Smokeless tobacco: Never Substance and Sexual Activity Alcohol use: Not Currently Drug use: Never  Objective:  Vitals: BP: (!) 120/100 Pulse: 80 Temp: 36.7 C (98 F) SpO2: (!) 90% Weight: 80.3 kg (177 lb) Height: 167.6 cm ('5\' 6"'$ )  Body mass index is 28.57 kg/m.  Physical Exam Vitals reviewed. Constitutional: General: She is not in acute distress. Appearance: Normal appearance. HENT: Head: Normocephalic and atraumatic. Right Ear: External ear normal. Left Ear: External ear normal. Nose: Nose normal. Mouth/Throat: Mouth: Mucous membranes are moist. Pharynx: Oropharynx is clear. Eyes: General: No scleral icterus. Extraocular Movements: Extraocular movements intact. Conjunctiva/sclera: Conjunctivae normal. Pupils: Pupils are equal, round, and reactive to light. Cardiovascular: Rate and Rhythm: Normal rate and regular rhythm. Pulses: Normal pulses. Heart sounds: Normal heart sounds. Pulmonary: Effort: Pulmonary effort is normal. No respiratory distress. Breath sounds: Normal breath sounds. Abdominal: General: Bowel sounds are normal. Palpations: Abdomen is soft. Tenderness: There is no abdominal tenderness. Musculoskeletal: General: No swelling, tenderness or deformity. Normal range of motion. Cervical  back: Normal range of motion and neck supple. Skin: General: Skin is warm and dry. Coloration: Skin is not jaundiced. Neurological: General:  No focal deficit present. Mental Status: She is alert and oriented to person, place, and time. Psychiatric: Mood and Affect: Mood normal. Behavior: Behavior normal.    Breast: There is no palpable mass in either breast. There is no palpable axillary, supraclavicular, or cervical lymphadenopathy. There is some tenderness to palpation in the upper portion of the right breast.  Labs, Imaging and Diagnostic Testing:  Assessment and Plan:  Diagnoses and all orders for this visit:  Fibroadenoma of right breast    The patient appears to have a 1.4 cm fibroadenoma in the upper portion of the right breast. Because it is causing her pain she would like to have this removed and I feel like this is very reasonable. I have discussed with her in detail the risks and benefits of the operation as well as some of the technical aspects including the use of a radioactive seed for localization and she understands and wishes to proceed.

## 2021-12-05 NOTE — Transfer of Care (Signed)
Immediate Anesthesia Transfer of Care Note  Patient: Cambri Plourde  Procedure(s) Performed: RIGHT BREAST LUMPECTOMY WITH RADIOACTIVE SEED LOCALIZATION (Right: Breast)  Patient Location: PACU  Anesthesia Type:General  Level of Consciousness: sedated  Airway & Oxygen Therapy: Patient Spontanous Breathing and Patient connected to nasal cannula oxygen  Post-op Assessment: Report given to RN and Post -op Vital signs reviewed and stable  Post vital signs: Reviewed and stable  Last Vitals:  Vitals Value Taken Time  BP    Temp    Pulse    Resp    SpO2      Last Pain:  Vitals:   12/05/21 0841  TempSrc: Oral  PainSc: 5       Patients Stated Pain Goal: 1 (16/10/96 0454)  Complications: No notable events documented.

## 2021-12-05 NOTE — Anesthesia Postprocedure Evaluation (Signed)
Anesthesia Post Note  Patient: Dominique Stevens  Procedure(s) Performed: RIGHT BREAST LUMPECTOMY WITH RADIOACTIVE SEED LOCALIZATION (Right: Breast)     Patient location during evaluation: PACU Anesthesia Type: General Level of consciousness: awake and alert Pain management: pain level controlled Vital Signs Assessment: post-procedure vital signs reviewed and stable Respiratory status: spontaneous breathing, nonlabored ventilation and respiratory function stable Cardiovascular status: stable and blood pressure returned to baseline Anesthetic complications: no   No notable events documented.  Last Vitals:  Vitals:   12/05/21 1100 12/05/21 1109  BP: 101/77 122/82  Pulse: 69 83  Resp: (!) 9 14  Temp:  36.5 C  SpO2: 94% 96%    Last Pain:  Vitals:   12/05/21 1109  TempSrc: Oral  PainSc: [place]

## 2021-12-06 ENCOUNTER — Encounter (HOSPITAL_BASED_OUTPATIENT_CLINIC_OR_DEPARTMENT_OTHER): Payer: Self-pay | Admitting: General Surgery

## 2021-12-08 LAB — SURGICAL PATHOLOGY

## 2021-12-15 ENCOUNTER — Other Ambulatory Visit: Payer: Self-pay | Admitting: Internal Medicine

## 2021-12-15 MED ORDER — HYDROCODONE-ACETAMINOPHEN 5-325 MG PO TABS: 1.0000 | ORAL_TABLET | Freq: Three times a day (TID) | ORAL | 0 refills | Status: DC

## 2021-12-25 ENCOUNTER — Ambulatory Visit (INDEPENDENT_AMBULATORY_CARE_PROVIDER_SITE_OTHER): Payer: Commercial Managed Care - HMO | Admitting: Internal Medicine

## 2021-12-25 ENCOUNTER — Encounter: Payer: Self-pay | Admitting: Internal Medicine

## 2021-12-25 VITALS — BP 120/80 | HR 95 | Temp 98.1°F | Ht 66.0 in | Wt 181.0 lb

## 2021-12-25 DIAGNOSIS — N644 Mastodynia: Secondary | ICD-10-CM | POA: Diagnosis not present

## 2021-12-25 DIAGNOSIS — F419 Anxiety disorder, unspecified: Secondary | ICD-10-CM | POA: Diagnosis not present

## 2021-12-25 MED ORDER — ESCITALOPRAM OXALATE 20 MG PO TABS: 20.0000 mg | ORAL_TABLET | Freq: Every day | ORAL | 3 refills | Status: DC

## 2021-12-25 MED ORDER — HYDROCODONE-ACETAMINOPHEN 5-325 MG PO TABS: 1.0000 | ORAL_TABLET | Freq: Three times a day (TID) | ORAL | 0 refills | Status: DC

## 2021-12-25 NOTE — Assessment & Plan Note (Signed)
Increase lexapro to 20 mg daily due to life stressors. Prior to those 10 mg was helping. Once life stressors better we may be able to reduce dose to 10 mg daily again. New rx done.

## 2021-12-25 NOTE — Progress Notes (Signed)
   Subjective:   Patient ID: Dominique Stevens, female    DOB: August 29, 1975, 46 y.o.   MRN: [id number]  HPI Ongoing breast pain and worsening anxiety due to breast problems and dog passing.  Review of Systems  Constitutional:  Positive for activity change.  HENT: Negative.    Eyes: Negative.   Respiratory:  Negative for cough, chest tightness and shortness of breath.   Cardiovascular:  Negative for chest pain, palpitations and leg swelling.       Breast pain  Gastrointestinal:  Negative for abdominal distention, abdominal pain, constipation, diarrhea, nausea and vomiting.  Musculoskeletal: Negative.   Skin: Negative.   Neurological: Negative.   Psychiatric/Behavioral:  Positive for decreased concentration and dysphoric mood. The patient is nervous/anxious.     Objective:  Physical Exam Constitutional:      Appearance: She is well-developed.  HENT:     Head: Normocephalic and atraumatic.  Cardiovascular:     Rate and Rhythm: Normal rate and regular rhythm.     Comments: Right breast with tenderness in the breast and at excision. No clear fluctuance or seroma. Incision healing well Pulmonary:     Effort: Pulmonary effort is normal. No respiratory distress.     Breath sounds: Normal breath sounds. No wheezing or rales.  Abdominal:     General: Bowel sounds are normal. There is no distension.     Palpations: Abdomen is soft.     Tenderness: There is no abdominal tenderness. There is no rebound.  Musculoskeletal:     Cervical back: Normal range of motion.  Skin:    General: Skin is warm and dry.  Neurological:     Mental Status: She is alert and oriented to person, place, and time.     Coordination: Coordination normal.     Vitals:   12/25/21 1023  BP: 120/80  Pulse: 95  Temp: 98.1 F (36.7 C)  TempSrc: Oral  SpO2: 97%  Weight: 181 lb (82.1 kg)  Height: '5\' 6"'$  (1.676 m)    Assessment & Plan:  Visit time 25 minutes in face to face communication with patient and  coordination of care, additional 5 minutes spent in record review, coordination or care, ordering tests, communicating/referring to other healthcare professionals, documenting in medical records all on the same day of the visit for total time 30 minutes spent on the visit.

## 2021-12-25 NOTE — Assessment & Plan Note (Signed)
Due to recent excision which was not cancerous. She has gotten oxycodone from surgeon but this is overly sedating and she cannot cut in half due to small size of pills. She used hydrocodone 5/325 from [place] and was able to use 1/2 pill for pain as needed. Refilled this and expect that she had a small hematoma or seroma after procedure which seems to be resolving slowly. Encouraged warm compress to help.

## 2021-12-25 NOTE — Patient Instructions (Signed)
We will increase the lexapro 20 mg daily and have sent in the refill of the hydrocodone to use for pain.

## 2021-12-28 ENCOUNTER — Other Ambulatory Visit: Payer: Self-pay | Admitting: Internal Medicine

## 2021-12-28 DIAGNOSIS — F419 Anxiety disorder, unspecified: Secondary | ICD-10-CM

## 2021-12-31 ENCOUNTER — Telehealth: Payer: Self-pay | Admitting: Internal Medicine

## 2021-12-31 NOTE — Telephone Encounter (Signed)
Caller & Relationship to patient:  patient   Call back number: [phone]   Date of last office visit:  12/25/2021   Date of next office visit:   Medication(s) to be refilled:  Hydrocodone        Preferred Pharmacy:  [facility] in [place]

## 2022-01-02 ENCOUNTER — Other Ambulatory Visit: Payer: Self-pay | Admitting: Internal Medicine

## 2022-01-02 MED ORDER — HYDROCODONE-ACETAMINOPHEN 5-325 MG PO TABS: 1.0000 | ORAL_TABLET | Freq: Three times a day (TID) | ORAL | 0 refills | Status: AC

## 2022-01-02 MED ORDER — CYCLOBENZAPRINE HCL 10 MG PO TABS: 5.0000 mg | ORAL_TABLET | Freq: Three times a day (TID) | ORAL | 0 refills | Status: DC | PRN

## 2022-01-02 NOTE — Telephone Encounter (Signed)
I have sent in flexeril which is a muscle relaxer. It is possible she pulled a muscle with doing things. Try this first up to 3 times a day. I have also sent in a small supply of hydrocodone but recommend she use this only if necessary.

## 2022-01-02 NOTE — Telephone Encounter (Signed)
Patient has called and stated that her armpit is swollen and has been in pain for about 3 days (since Wednesday). If there are any questions patient states she can be contacted at [phone].

## 2022-01-02 NOTE — Telephone Encounter (Signed)
Spoke with patient and relayed back the message from Dr Sharlet Salina for patients concerns about her pain.

## 2022-01-02 NOTE — Telephone Encounter (Signed)
Is pain improving at all? Typically pain would be markedly better by now and further opioids are generally not needed.

## 2022-01-02 NOTE — Telephone Encounter (Signed)
Spoke with patient who was seen about 8 days ago in office and was in tears due to pain she is having from her breast to her armpit. Patient has been trying to reach Dr Sharlet Salina or her Nurse since this past Wednesday but was told she could speak with her nurse.

## 2022-01-20 ENCOUNTER — Other Ambulatory Visit: Payer: Self-pay | Admitting: Internal Medicine

## 2022-01-20 DIAGNOSIS — F419 Anxiety disorder, unspecified: Secondary | ICD-10-CM

## 2022-01-22 ENCOUNTER — Encounter (HOSPITAL_COMMUNITY): Payer: Self-pay

## 2022-01-22 ENCOUNTER — Encounter: Payer: Self-pay | Admitting: Internal Medicine

## 2022-01-22 ENCOUNTER — Other Ambulatory Visit: Payer: Self-pay | Admitting: Internal Medicine

## 2022-01-22 DIAGNOSIS — F419 Anxiety disorder, unspecified: Secondary | ICD-10-CM

## 2022-01-22 NOTE — Telephone Encounter (Signed)
Patient called concerning this rx - she wants to know when it will be called in.  Patient's number:  [phone]

## 2022-01-22 NOTE — Telephone Encounter (Signed)
Patient called to follow up on this

## 2022-01-23 NOTE — Telephone Encounter (Signed)
Patient called back and said that she hasn't been taking any extra and that she will be out on Sunday

## 2022-01-23 NOTE — Telephone Encounter (Signed)
This will get done but is not due for refill until 01/26/22 so patient should not be out of medication at this time. Is she taking more than prescribed?

## 2022-02-25 ENCOUNTER — Telehealth: Payer: Self-pay | Admitting: Internal Medicine

## 2022-02-25 ENCOUNTER — Telehealth (INDEPENDENT_AMBULATORY_CARE_PROVIDER_SITE_OTHER): Payer: Commercial Managed Care - HMO | Admitting: Family

## 2022-02-25 ENCOUNTER — Encounter: Payer: Self-pay | Admitting: Family

## 2022-02-25 VITALS — Ht 66.0 in | Wt 178.0 lb

## 2022-02-25 DIAGNOSIS — J069 Acute upper respiratory infection, unspecified: Secondary | ICD-10-CM

## 2022-02-25 MED ORDER — GUAIFENESIN-CODEINE 100-10 MG/5ML PO SYRP: 5.0000 mL | ORAL_SOLUTION | Freq: Three times a day (TID) | ORAL | 0 refills | Status: DC | PRN

## 2022-02-25 MED ORDER — HYDROCOD POLI-CHLORPHE POLI ER 10-8 MG/5ML PO SUER: 5.0000 mL | Freq: Two times a day (BID) | ORAL | 0 refills | Status: DC | PRN

## 2022-02-25 NOTE — Progress Notes (Signed)
MyChart Video Visit    Virtual Visit via Video Note   This format is felt to be most appropriate for this patient at this time. Physical exam was limited by quality of the video and audio technology used for the visit. CMA was able to get the patient set up on a video visit.  Patient location: Home. Patient and provider in visit Provider location: Office  I discussed the limitations of evaluation and management by telemedicine and the availability of in person appointments. The patient expressed understanding and agreed to proceed.  Visit Date: 02/25/2022  Today's healthcare provider: Dulce Sellar, NP     Subjective:   Patient ID: Dominique Stevens, female    DOB: 13-Sep-1975, 47 y.o.   MRN: [id number]  Chief Complaint  Patient presents with   Sinus Problem    sx for 2d   HPI Sinus sx:  Pt c/o Nasal congestion, Bilateral ear pain, sore throat, headache, Dry cough and watery eyes. Present since Monday. Has tried dayquil/nyquil which did not help. Covid negative on Monday. Pt reports inability to sleep d/t cough and sinus sx and can't function well during the day.  Assessment & Plan:   Problem List Items Addressed This Visit   None Visit Diagnoses     Viral upper respiratory tract infection    -  Primary sending cough syrup to help with sleep. Continue OTC sinus/cold medications including generic Claritin D (look for 24h dose to lessen jittery SE), or Mucinex per box directions, Ibuprofen up to 600mg  or generic Tylenol 1,000mg  every 6 hours. Use nasal saline spray tid, humidifier overnight. Increase water intake to at least 2 liters qd.     Relevant Medications   guaiFENesin-codeine (ROBITUSSIN AC) 100-10 MG/5ML syrup   Past Medical History:  Diagnosis Date   Anxiety    Hypertension    Kidney stones     Past Surgical History:  Procedure Laterality Date   ABLATION     BREAST BIOPSY Right 10/21/2021   BREAST BIOPSY  12/04/2021   MM RT RADIOACTIVE SEED LOC MAMMO  GUIDE 12/04/2021 [facility] M[facility]MO[facility]   BREAST LUMPECTOMY WITH RADIOACTIVE SEED LOCALIZATION Right 12/05/2021   Procedure: RIGHT BREAST LUMPECTOMY WITH RADIOACTIVE SEED LOCALIZATION;  Surgeon: Chevis Pretty III, MD;  Location: [facility];  Service: General;  Laterality: Right;   CHOLECYSTECTOMY     TUBAL LIGATION      Outpatient Medications Prior to Visit  Medication Sig Dispense Refill   ALPRAZolam (XANAX) 0.25 MG tablet Take 1 tablet (0.25 mg total) by mouth 2 (two) times daily as needed for anxiety. 60 tablet 2   cyclobenzaprine (FLEXERIL) 10 MG tablet Take 0.5-1 tablets (5-10 mg total) by mouth 3 (three) times daily as needed for muscle spasms. 30 tablet 0   escitalopram (LEXAPRO) 20 MG tablet Take 1 tablet (20 mg total) by mouth daily. 90 tablet 3   esomeprazole (NEXIUM) 40 MG capsule Take 1 capsule (40 mg total) by mouth daily at 12 noon. 90 capsule 3   gabapentin (NEURONTIN) 100 MG capsule Take 1 capsule by mouth 3 (three) times daily.     Loratadine (CLARITIN PO) Take by mouth.     No facility-administered medications prior to visit.    No Known Allergies     Objective:   Physical Exam Vitals and nursing note reviewed.  Constitutional:      General: Pt is not in acute distress.    Appearance: Normal appearance.  HENT:     Head:  Normocephalic.  Pulmonary:     Effort: No respiratory distress.  Musculoskeletal:     Cervical back: Normal range of motion.  Skin:    General: Skin is dry.     Coloration: Skin is not pale.  Neurological:     Mental Status: Pt is alert and oriented to person, place, and time.  Psychiatric:        Mood and Affect: Mood normal.   Ht 5\' 6"  (1.676 m)   Wt 178 lb (80.7 kg) Comment: pt reported  BMI 28.73 kg/m   Wt Readings from Last 3 Encounters:  02/25/22 178 lb (80.7 kg)  12/25/21 181 lb (82.1 kg)  11/26/21 170 lb (77.1 kg)      I discussed the assessment and treatment plan with the patient. The patient was provided an  opportunity to ask questions and all were answered. The patient agreed with the plan and demonstrated an understanding of the instructions.   The patient was advised to call back or seek an in-person evaluation if the symptoms worsen or if the condition fails to improve as anticipated.  Dulce Sellar, NP [facility] [phone] (phone) [phone] (fax)  [facility]

## 2022-02-25 NOTE — Telephone Encounter (Signed)
Patient was prescribed guaiFENesin-codeine , her pharmacy does not have this medication. Would like to be prescribed something else .

## 2022-02-25 NOTE — Telephone Encounter (Signed)
Pt states the pharmacy does not have the RX  guaiFENesin-codeine (ROBITUSSIN AC) 100-10 MG/5ML syrup  Can we please sends a different RX. Please advise.

## 2022-02-25 NOTE — Telephone Encounter (Signed)
I returned pt's call to let her know [facility] [place] will be sending in a different RX. Pt gave a verbalized understanding.

## 2022-02-26 ENCOUNTER — Telehealth: Payer: Self-pay | Admitting: Internal Medicine

## 2022-02-26 ENCOUNTER — Other Ambulatory Visit: Payer: Self-pay

## 2022-02-26 DIAGNOSIS — J069 Acute upper respiratory infection, unspecified: Secondary | ICD-10-CM

## 2022-02-26 MED ORDER — GUAIFENESIN-CODEINE 100-10 MG/5ML PO SYRP: 5.0000 mL | ORAL_SOLUTION | Freq: Three times a day (TID) | ORAL | 0 refills | Status: DC | PRN

## 2022-02-26 MED ORDER — PROMETHAZINE-DM 6.25-15 MG/5ML PO SYRP: 5.0000 mL | ORAL_SOLUTION | Freq: Four times a day (QID) | ORAL | 0 refills | Status: DC | PRN

## 2022-02-26 NOTE — Telephone Encounter (Signed)
Patient Name: Dominique Stevens ID Gender: Female DOB: 1975/03/25 Age: 47 Y 2 M 17 D Return Phone Number: [phone] (Primary) Address: City/ State/ Zip: [place]  [address] Client [place] [facility] [place] Client Site [place] [facility] [employer] Type Call Who Is Calling Patient / Member / Family / Caregiver Call Type Triage / Clinical Relationship To Patient Self Return Phone Number [phone] (Primary) Chief Complaint Cough Reason for Call Medication Question / Request Initial Comment Caller stated she needs medication transfer to another [facility]. New pharmacy number is [phone]. Patient currently has sinus issues and cough. Translation No Disp. Time Eilene Ghazi Time) Disposition Final User 02/25/2022 7:04:33 PM Clinical Call Yes Lavina Hamman, RN, Thomasena Edis Final Disposition 02/25/2022 7:04:33 PM Clinical Call Yes Lavina Hamman, RN, Thomasena Edis Comments User: Farrel Gobble, RN Date/Time Eilene Ghazi Time): 02/25/2022 7:04:30 PM States med is out of stock at pharmacy - because med is controlled will need to call back in AM

## 2022-02-26 NOTE — Telephone Encounter (Signed)
Patient requests RX for Tussionex cough suppressant be sent to  [facility] located at [address] in [place] Phone# [phone]

## 2022-02-26 NOTE — Addendum Note (Signed)
Addended by: Hoyt Koch on: 02/26/2022 10:46 AM   Modules accepted: Orders

## 2022-02-26 NOTE — Addendum Note (Signed)
Addended byJeanie Sewer on: 02/26/2022 12:57 PM   Modules accepted: Level of Service

## 2022-02-27 ENCOUNTER — Telehealth: Payer: Self-pay | Admitting: Internal Medicine

## 2022-02-27 MED ORDER — BENZONATATE 200 MG PO CAPS: 200.0000 mg | ORAL_CAPSULE | Freq: Three times a day (TID) | ORAL | 0 refills | Status: DC | PRN

## 2022-02-27 NOTE — Telephone Encounter (Signed)
Pt called stating the medication  promethazine-dextromethorphan (PROMETHAZINE-DM) 6.25-15 MG/5ML syrup isn't working she still having bad coughs and hasn't ease up pt started medication yesterday morning.  Please call pt with update  Preferred pharmacy [facility], [place]

## 2022-02-27 NOTE — Telephone Encounter (Signed)
Sent in

## 2022-02-27 NOTE — Telephone Encounter (Signed)
Given her other medicines I do not feel hydrocodone based cough medicine is advised. We can offer tessalon perles to help as well if she wants.

## 2022-02-27 NOTE — Addendum Note (Signed)
Addended by: Hoyt Koch on: 02/27/2022 07:29 PM   Modules accepted: Orders

## 2022-02-27 NOTE — Telephone Encounter (Signed)
Pt is okay with Tesalon prescription.

## 2022-03-02 ENCOUNTER — Other Ambulatory Visit: Payer: Self-pay

## 2022-03-02 NOTE — Telephone Encounter (Signed)
I returned pt's call, Please see pt's PCP note in chart. Dr. Sharlet Salina stated she does not advise hydrocodone cough syrup. I let pt know to contact PCP, pt gave a verbalized understanding.

## 2022-03-02 NOTE — Telephone Encounter (Signed)
Left voicemail for patient that her medication has been sent in for her

## 2022-04-14 ENCOUNTER — Other Ambulatory Visit: Payer: Self-pay | Admitting: Internal Medicine

## 2022-04-14 ENCOUNTER — Telehealth: Payer: Self-pay | Admitting: Internal Medicine

## 2022-04-14 MED ORDER — NYSTATIN-TRIAMCINOLONE 100000-0.1 UNIT/GM-% EX OINT: 1.0000 | TOPICAL_OINTMENT | Freq: Two times a day (BID) | CUTANEOUS | 0 refills | Status: DC

## 2022-04-14 NOTE — Telephone Encounter (Signed)
Cream sent in. Okay for visit if needed for menopause symptoms.

## 2022-04-14 NOTE — Telephone Encounter (Signed)
PT calls today in regards to onset of symptoms. PT had noted during their last visit that she was going through menopause. At that time she was advised to reach out if she started experiencing any symptoms from this, and within the last 3 days PT has been waking up with night sweats. PT also noted the friction of the sweat creating a rash with a yeasty smell and noted a cream being prescribed to her in the past for the rash. PT last seen by [place] in December.   CB: [phone]

## 2022-04-15 MED ORDER — NYSTATIN 100000 UNIT/GM EX CREA: 1.0000 | TOPICAL_CREAM | Freq: Two times a day (BID) | CUTANEOUS | 0 refills | Status: DC

## 2022-04-15 NOTE — Telephone Encounter (Signed)
Called pt inform her w/ MD resdponse. Pt states cream is not cover by her insurance its $634.00. Requesting something else.Marland KitchenRaechel Chute

## 2022-04-15 NOTE — Telephone Encounter (Signed)
Pt called back saying the cream through her insurance was 600 Dollars is there any way she can can get an alternate rx at a cheaper price. Call pt back with update.

## 2022-04-15 NOTE — Telephone Encounter (Signed)
Sent in alternative cream.

## 2022-04-15 NOTE — Addendum Note (Signed)
Addended by: Hillard Danker A on: 04/15/2022 10:38 AM   Modules accepted: Orders

## 2022-04-15 NOTE — Telephone Encounter (Signed)
Notified pt MD sent new cream pof.Marland KitchenShearon Stalls

## 2022-04-20 ENCOUNTER — Encounter: Payer: Self-pay | Admitting: Internal Medicine

## 2022-04-20 ENCOUNTER — Ambulatory Visit (INDEPENDENT_AMBULATORY_CARE_PROVIDER_SITE_OTHER): Payer: 59 | Admitting: Internal Medicine

## 2022-04-20 VITALS — BP 138/100 | HR 88 | Temp 98.4°F | Ht 66.0 in | Wt 189.0 lb

## 2022-04-20 DIAGNOSIS — F419 Anxiety disorder, unspecified: Secondary | ICD-10-CM

## 2022-04-20 DIAGNOSIS — R030 Elevated blood-pressure reading, without diagnosis of hypertension: Secondary | ICD-10-CM | POA: Diagnosis not present

## 2022-04-20 DIAGNOSIS — R232 Flushing: Secondary | ICD-10-CM | POA: Diagnosis not present

## 2022-04-20 DIAGNOSIS — Z111 Encounter for screening for respiratory tuberculosis: Secondary | ICD-10-CM

## 2022-04-20 MED ORDER — ESTRADIOL-NORETHINDRONE ACET 0.5-0.1 MG PO TABS: 1.0000 | ORAL_TABLET | Freq: Every day | ORAL | 3 refills | Status: DC

## 2022-04-20 MED ORDER — ALPRAZOLAM 0.5 MG PO TABS: 0.5000 mg | ORAL_TABLET | Freq: Two times a day (BID) | ORAL | 5 refills | Status: DC | PRN

## 2022-04-20 NOTE — Patient Instructions (Addendum)
We have sent in the hormone medicine to take daily.

## 2022-04-20 NOTE — Assessment & Plan Note (Signed)
Refill alprazolam 0.5 mg BID prn and using with lexapro 20 mg daily and overall much better controlled.

## 2022-04-20 NOTE — Assessment & Plan Note (Signed)
Is taking otc cold products which can cause elevation. Monitor closely.

## 2022-04-20 NOTE — Progress Notes (Unsigned)
   Subjective:   Patient ID: Dominique Stevens, female    DOB: 1975-04-25, 47 y.o.   MRN: [id number]  HPI The patient is a 47 YO female coming in for menopause symptoms. Taking lexapro 20 mg daily for anxiety and this is helping well. Not helping hot flashes.   Review of Systems  Constitutional: Negative.   HENT: Negative.    Eyes: Negative.   Respiratory:  Negative for cough, chest tightness and shortness of breath.   Cardiovascular:  Negative for chest pain, palpitations and leg swelling.  Gastrointestinal:  Negative for abdominal distention, abdominal pain, constipation, diarrhea, nausea and vomiting.  Endocrine: Positive for heat intolerance.  Musculoskeletal: Negative.   Skin: Negative.   Neurological: Negative.   Psychiatric/Behavioral: Negative.      Objective:  Physical Exam Constitutional:      Appearance: She is well-developed.  HENT:     Head: Normocephalic and atraumatic.  Cardiovascular:     Rate and Rhythm: Normal rate and regular rhythm.  Pulmonary:     Effort: Pulmonary effort is normal. No respiratory distress.     Breath sounds: Normal breath sounds. No wheezing or rales.  Abdominal:     General: Bowel sounds are normal. There is no distension.     Palpations: Abdomen is soft.     Tenderness: There is no abdominal tenderness. There is no rebound.  Musculoskeletal:     Cervical back: Normal range of motion.  Skin:    General: Skin is warm and dry.  Neurological:     Mental Status: She is alert and oriented to person, place, and time.     Coordination: Coordination normal.     Vitals:   04/20/22 1042 04/20/22 1045  BP: (!) 138/100 (!) 138/100  Pulse: 88   Temp: 98.4 F (36.9 C)   TempSrc: Oral   SpO2: 96%   Weight: 189 lb (85.7 kg)   Height: 5\' 6"  (1.676 m)     Assessment & Plan:  Tb skin test placed

## 2022-04-20 NOTE — Assessment & Plan Note (Signed)
Having more symptoms. Rx hormone treatment with combination estrogen/progesterone. Adjust as needed based on results.

## 2022-04-22 ENCOUNTER — Ambulatory Visit: Payer: 59 | Admitting: *Deleted

## 2022-04-22 LAB — TB SKIN TEST
Induration: 0 mm
TB Skin Test: NEGATIVE

## 2022-04-27 NOTE — Telephone Encounter (Signed)
Med was changed to Nystatin cream, is PA still needed? Thank you

## 2022-04-30 ENCOUNTER — Other Ambulatory Visit (HOSPITAL_COMMUNITY): Payer: Self-pay

## 2022-05-04 ENCOUNTER — Telehealth: Payer: Self-pay | Admitting: Internal Medicine

## 2022-05-04 ENCOUNTER — Telehealth: Payer: Self-pay

## 2022-05-04 ENCOUNTER — Other Ambulatory Visit (HOSPITAL_COMMUNITY): Payer: Self-pay

## 2022-05-04 NOTE — Telephone Encounter (Signed)
Sent msg via [place] on PA approval../lmb

## 2022-05-04 NOTE — Telephone Encounter (Signed)
PA approved. Effective: 05/04/22 - 05/04/23

## 2022-05-04 NOTE — Telephone Encounter (Signed)
Pt called in needing medication sent over to PA esomeprazole (NEXIUM) 40 MG capsule

## 2022-05-04 NOTE — Telephone Encounter (Signed)
Prior authorization for Esomeprazole Magnesium 40MG  dr capsules submitted and APPROVED. Test billing returns $0 copay for 30 day supply.  Key [id number] Effective: 05/04/22 - 05/04/23

## 2022-06-13 ENCOUNTER — Other Ambulatory Visit: Payer: Self-pay | Admitting: Internal Medicine

## 2022-06-26 ENCOUNTER — Ambulatory Visit: Payer: Commercial Managed Care - HMO | Admitting: Internal Medicine

## 2022-07-28 ENCOUNTER — Other Ambulatory Visit: Payer: Self-pay | Admitting: Internal Medicine

## 2022-09-01 ENCOUNTER — Other Ambulatory Visit: Payer: Self-pay | Admitting: Internal Medicine

## 2022-09-28 ENCOUNTER — Other Ambulatory Visit: Payer: Self-pay | Admitting: Internal Medicine

## 2022-09-28 DIAGNOSIS — F419 Anxiety disorder, unspecified: Secondary | ICD-10-CM

## 2022-11-19 ENCOUNTER — Other Ambulatory Visit: Payer: Self-pay | Admitting: Internal Medicine

## 2022-11-23 ENCOUNTER — Telehealth: Payer: Self-pay | Admitting: Internal Medicine

## 2022-11-23 NOTE — Telephone Encounter (Signed)
Pt called asking to speak with her nurse "Micaiah". " I don't want to talk to anyone else but her!!" She's calling about the symptoms she's having and wants to know will she be okay until her next appointment and what to do to be okay until then.  PLEASE ADVISE.Marland Kitchen Thanks.  CB-- [phone] - Dominique Stevens (SELF)

## 2022-11-23 NOTE — Telephone Encounter (Signed)
Spoke with patient and she sated that she really doesn't want see anyone else nor go to the ER. She states that she has a history of pneumonia and her symptoms are   -Can't catch her breathe  -A lot of pain -She has an old inhaler that she is using and possibly need an updated one  -Fever is 100 and tylenol and Ibis not helping  I told her if she is not wanting to come in and be seen by someone else then she may go to ER Especially with her provider being fully booked today as well  Please advise patient on what to do.

## 2022-11-23 NOTE — Telephone Encounter (Signed)
Patient has been scheduled for tomorrow her preference

## 2022-11-23 NOTE — Telephone Encounter (Signed)
Patient called back asking to speak with Micaiah. She has an appointment scheduled for 11/26/22 with Dr. Okey Dupre, but would like to be seen sooner. She refused to schedule an appointment with any other providers. Best callback is [phone].

## 2022-11-23 NOTE — Telephone Encounter (Signed)
Most appropriate is to see another provider at the office today. We do have availability. This is my recommendation

## 2022-11-24 ENCOUNTER — Telehealth: Payer: Self-pay | Admitting: Internal Medicine

## 2022-11-24 ENCOUNTER — Ambulatory Visit (INDEPENDENT_AMBULATORY_CARE_PROVIDER_SITE_OTHER): Payer: 59 | Admitting: Internal Medicine

## 2022-11-24 VITALS — BP 126/100 | HR 97 | Temp 98.6°F | Ht 66.0 in | Wt 183.0 lb

## 2022-11-24 DIAGNOSIS — R051 Acute cough: Secondary | ICD-10-CM | POA: Insufficient documentation

## 2022-11-24 MED ORDER — METHYLPREDNISOLONE ACETATE 40 MG/ML IJ SUSP
40.0000 mg | Freq: Once | INTRAMUSCULAR | Status: AC
Start: 2022-11-24 — End: 2022-11-24
  Administered 2022-11-24: 40 mg via INTRAMUSCULAR

## 2022-11-24 MED ORDER — HYDROCODONE BIT-HOMATROP MBR 5-1.5 MG/5ML PO SOLN: 5.0000 mL | Freq: Three times a day (TID) | ORAL | 0 refills | Status: DC | PRN

## 2022-11-24 NOTE — Assessment & Plan Note (Signed)
Given depo-medrol 40 mg IM and rx hycodan cough syrup done. Suspect viral. If not improving can do chest x-ray.

## 2022-11-24 NOTE — Telephone Encounter (Signed)
Patient called back and apologized.  The medication is the cough syrup hycodone -  - Patient states that last time you had the tussinex and it went through.  Is this something that you could send in.    Please call patient on Wednesday am.

## 2022-11-24 NOTE — Progress Notes (Unsigned)
   Subjective:   Patient ID: Dominique Stevens, female    DOB: 1975/03/22, 47 y.o.   MRN: [id number]  HPI The patient is a 47 YO female coming in for cough and fever about 2 days with pain in the back and deep cough. Some wheezing at night time and promethazine/dm cough syrup not working.   Review of Systems  Constitutional:  Positive for activity change, chills and fatigue.  HENT:  Positive for congestion and sinus pressure.   Eyes: Negative.   Respiratory:  Positive for cough and wheezing. Negative for chest tightness and shortness of breath.   Cardiovascular:  Negative for chest pain, palpitations and leg swelling.  Gastrointestinal:  Negative for abdominal distention, abdominal pain, constipation, diarrhea, nausea and vomiting.  Musculoskeletal: Negative.   Skin: Negative.   Neurological:  Positive for headaches.  Psychiatric/Behavioral: Negative.      Objective:  Physical Exam Constitutional:      Appearance: She is well-developed.  HENT:     Head: Normocephalic and atraumatic.  Cardiovascular:     Rate and Rhythm: Normal rate and regular rhythm.  Pulmonary:     Effort: Pulmonary effort is normal. No respiratory distress.     Breath sounds: Wheezing present. No rales.  Abdominal:     General: Bowel sounds are normal. There is no distension.     Palpations: Abdomen is soft.     Tenderness: There is no abdominal tenderness. There is no rebound.  Musculoskeletal:     Cervical back: Normal range of motion.  Skin:    General: Skin is warm and dry.  Neurological:     Mental Status: She is alert and oriented to person, place, and time.     Coordination: Coordination normal.     Vitals:   11/24/22 1527  BP: (!) 126/100  Pulse: 97  Temp: 98.6 F (37 C)  TempSrc: Oral  SpO2: 98%  Weight: 183 lb (83 kg)  Height: 5\' 6"  (1.676 m)    Assessment & Plan:  Depo-medrol 40 mg IM

## 2022-11-24 NOTE — Telephone Encounter (Signed)
Patient called because she states that the pharmacy told her her medication would have to have a prior authorization.  I tried to explain to her that we have a prior authorization department that handles this, it is not Dr. Okey Stevens.  She got very angry and hung up.  I tried to ask her the medication name and she did not tell me.

## 2022-11-25 ENCOUNTER — Other Ambulatory Visit (HOSPITAL_COMMUNITY): Payer: Self-pay

## 2022-11-25 ENCOUNTER — Encounter: Payer: Self-pay | Admitting: Internal Medicine

## 2022-11-25 ENCOUNTER — Telehealth: Payer: Self-pay

## 2022-11-25 MED ORDER — HYDROCOD POLI-CHLORPHE POLI ER 10-8 MG/5ML PO SUER: 5.0000 mL | Freq: Two times a day (BID) | ORAL | 0 refills | Status: DC | PRN

## 2022-11-25 NOTE — Telephone Encounter (Signed)
Patient has also sent you a my chart message as well. I can close this encounter if that's ok.

## 2022-11-25 NOTE — Telephone Encounter (Signed)
Sent message to PA team for the patient

## 2022-11-25 NOTE — Telephone Encounter (Signed)
Pharmacy Patient Advocate Encounter   Received notification from Pt Calls Messages that prior authorization for Hydrocodone/Homatrop 5-1.5mg /61ml is required/requested.   Insurance verification completed.   The patient is insured through [facility] .   Per test claim: PA required; PA submitted to above mentioned insurance via CoverMyMeds Key/confirmation #/EOC [id number] Status is pending

## 2022-11-25 NOTE — Telephone Encounter (Signed)
Did she get resolution? If not we should not close the encounter. Did she get her medication?

## 2022-11-25 NOTE — Telephone Encounter (Signed)
Sent in tussionex to see if this would go through. She should not fill both

## 2022-11-25 NOTE — Telephone Encounter (Signed)
No she has not. I have sent over a message to the rx team to start the PA for her hydrocodone. Waiting on response if the PA has been approved or not

## 2022-11-25 NOTE — Telephone Encounter (Signed)
I will send a message to our Rx team to start PA for the medication.

## 2022-11-26 ENCOUNTER — Encounter: Payer: Self-pay | Admitting: Internal Medicine

## 2022-11-26 ENCOUNTER — Ambulatory Visit (INDEPENDENT_AMBULATORY_CARE_PROVIDER_SITE_OTHER): Payer: 59 | Admitting: Internal Medicine

## 2022-11-26 VITALS — BP 136/104 | HR 106 | Temp 98.4°F | Ht 66.0 in | Wt 183.0 lb

## 2022-11-26 DIAGNOSIS — N644 Mastodynia: Secondary | ICD-10-CM | POA: Diagnosis not present

## 2022-11-26 DIAGNOSIS — R051 Acute cough: Secondary | ICD-10-CM | POA: Diagnosis not present

## 2022-11-26 DIAGNOSIS — F419 Anxiety disorder, unspecified: Secondary | ICD-10-CM | POA: Diagnosis not present

## 2022-11-26 NOTE — Progress Notes (Signed)
   Subjective:   Patient ID: Dominique Stevens, female    DOB: Oct 07, 1975, 47 y.o.   MRN: [id number]  HPI The patient is a 47 YO female coming in for concerns about ongoing stress and breast pain.   Review of Systems  Constitutional:  Positive for activity change.  HENT:  Positive for congestion.   Eyes: Negative.   Respiratory:  Positive for cough. Negative for chest tightness and shortness of breath.   Cardiovascular:  Negative for chest pain, palpitations and leg swelling.  Gastrointestinal:  Negative for abdominal distention, abdominal pain, constipation, diarrhea, nausea and vomiting.  Musculoskeletal: Negative.   Skin: Negative.   Neurological: Negative.   Psychiatric/Behavioral:  Positive for dysphoric mood and sleep disturbance. The patient is nervous/anxious.     Objective:  Physical Exam Constitutional:      Appearance: She is well-developed.  HENT:     Head: Normocephalic and atraumatic.  Cardiovascular:     Rate and Rhythm: Normal rate and regular rhythm.  Pulmonary:     Effort: Pulmonary effort is normal. No respiratory distress.     Breath sounds: Normal breath sounds. No wheezing or rales.  Abdominal:     General: Bowel sounds are normal. There is no distension.     Palpations: Abdomen is soft.     Tenderness: There is no abdominal tenderness. There is no rebound.  Musculoskeletal:     Cervical back: Normal range of motion.  Skin:    General: Skin is warm and dry.  Neurological:     Mental Status: She is alert and oriented to person, place, and time.     Coordination: Coordination normal.     Vitals:   11/26/22 1008 11/26/22 1018  BP: (!) 136/104 (!) 136/104  Pulse: (!) 106   Temp: 98.4 F (36.9 C)   TempSrc: Oral   SpO2: 98%   Weight: 183 lb (83 kg)   Height: 5\' 6"  (1.676 m)     Assessment & Plan:  Visit time 35 minutes in face to face communication with patient and coordination of care, additional 7 minutes spent in record review, coordination or  care, ordering tests, communicating/referring to other healthcare professionals, documenting in medical records all on the same day of the visit for total time 42 minutes spent on the visit.

## 2022-11-27 NOTE — Assessment & Plan Note (Signed)
With significant caregiver stress and we discussed in depth today. She is using alprazolam 0.5 mg BID prn and lexapro 20 mg daily. She would like to try coping skills rather than medication change and reassess in a few months.

## 2022-11-27 NOTE — Assessment & Plan Note (Signed)
Breast exam done and due for mammogram which was ordered today.

## 2022-11-27 NOTE — Assessment & Plan Note (Signed)
Lungs sound clearer without wheezing prednisone is working. No antibiotics indicated today.

## 2022-11-30 ENCOUNTER — Other Ambulatory Visit: Payer: Self-pay | Admitting: Internal Medicine

## 2022-11-30 DIAGNOSIS — N644 Mastodynia: Secondary | ICD-10-CM

## 2022-12-01 NOTE — Telephone Encounter (Signed)
Pharmacy Patient Advocate Encounter  Received notification from [facility] that Prior Authorization for Hydrocodone-homatrop 5-1.5mg /69ml sol has been CANCELLED due to the plan allows a 7 day supply per 30 days. The representative stated that no prior authorization is required.

## 2022-12-02 ENCOUNTER — Other Ambulatory Visit: Payer: Self-pay | Admitting: Internal Medicine

## 2022-12-02 MED ORDER — HYDROCOD POLI-CHLORPHE POLI ER 10-8 MG/5ML PO SUER: 5.0000 mL | Freq: Two times a day (BID) | ORAL | 0 refills | Status: DC | PRN

## 2022-12-07 ENCOUNTER — Ambulatory Visit
Admission: RE | Admit: 2022-12-07 | Discharge: 2022-12-07 | Disposition: A | Discharge status: Home / Self Care | Payer: 59 | Source: Ambulatory Visit | Attending: Internal Medicine | Admitting: Internal Medicine

## 2022-12-07 ENCOUNTER — Telehealth: Payer: Self-pay | Admitting: Internal Medicine

## 2022-12-07 DIAGNOSIS — N644 Mastodynia: Secondary | ICD-10-CM

## 2022-12-07 NOTE — Telephone Encounter (Signed)
Patient is having difficulty getting her cough syrup that Dr. Okey Dupre called in.  Please call patient at  [phone]

## 2022-12-07 NOTE — Telephone Encounter (Signed)
Please call before 1:00 pm or after 2:00 pm.  She will be at the [facility]

## 2022-12-10 NOTE — Telephone Encounter (Signed)
Called pt and lvm

## 2023-01-13 ENCOUNTER — Other Ambulatory Visit: Payer: Self-pay | Admitting: Internal Medicine

## 2023-03-11 ENCOUNTER — Other Ambulatory Visit: Payer: Self-pay | Admitting: Internal Medicine

## 2023-03-11 DIAGNOSIS — F419 Anxiety disorder, unspecified: Secondary | ICD-10-CM

## 2023-03-11 MED ORDER — ALPRAZOLAM 0.5 MG PO TABS: 0.5000 mg | ORAL_TABLET | Freq: Two times a day (BID) | ORAL | 5 refills | Status: DC | PRN

## 2023-03-11 NOTE — Telephone Encounter (Signed)
 LOV: 11/26/22 Last fill: 09/28/22, 60 tablet 5 refill

## 2023-05-01 ENCOUNTER — Other Ambulatory Visit: Payer: Self-pay | Admitting: Internal Medicine

## 2023-05-02 ENCOUNTER — Encounter: Payer: Self-pay | Admitting: Physician Assistant

## 2023-05-02 ENCOUNTER — Telehealth: Admitting: Physician Assistant

## 2023-05-02 DIAGNOSIS — R051 Acute cough: Secondary | ICD-10-CM

## 2023-05-02 NOTE — Progress Notes (Signed)
 We are not able to prescribe that medication with an evisit or virtual visit.  You would need to be seen in person at an UC or by your PCP.    Please let us  know if there is anything else I can do for you to help.  At this time you will not be charged for this evisit.   I have spent 5 minutes in review of e-visit questionnaire, review and updating patient chart, medical decision making and response to patient.   Etter Hermann Mayers, PA-C

## 2023-05-03 ENCOUNTER — Encounter: Payer: Self-pay | Admitting: Internal Medicine

## 2023-05-03 ENCOUNTER — Telehealth: Payer: Self-pay | Admitting: Internal Medicine

## 2023-05-04 NOTE — Telephone Encounter (Addendum)
 Called patient to discuss appointment options. She communicated with clinical team via MyChart about needs her for appointment. Patient did not answer phone a friend of her did. Friend stated they were at [facility] and was very condescending that we could not provide patient appointment today, after patient had already stated she could not come in today due to other commitments.  See MyChart message for detials

## 2023-05-04 NOTE — Telephone Encounter (Signed)
 Copied from CRM [id number]. Topic: Clinical - Medical Advice >> May 03, 2023  4:20 PM Turkey B wrote: Reason for CRM: pt called in, adamant about speaking with makiayah at [facility], for something for her cough. She was told she has to have an appt before getting hydrochodone,but sill wants to speak with office about it.

## 2023-05-04 NOTE — Telephone Encounter (Signed)
 Copied from CRM [id number]. Topic: General - Other >> May 04, 2023  1:24 PM Abigail D wrote: Reason for CRM: Patient calling for medication for her cough. She said that she would try promethazine  cough syrup per Dr. Constance Dellen recommendation, patient said she can't have tessalon  perles as they make her sick to her stomach.

## 2023-05-05 ENCOUNTER — Telehealth: Payer: Self-pay | Admitting: Internal Medicine

## 2023-05-05 DIAGNOSIS — I517 Cardiomegaly: Secondary | ICD-10-CM | POA: Diagnosis not present

## 2023-05-05 NOTE — Telephone Encounter (Unsigned)
 Copied from CRM [id number]. Topic: Clinical - Medication Refill >> May 05, 2023  8:42 AM Raven B wrote: Most Recent Primary Care Visit:  Provider: Bambi Lever A  Department: [facility]  Visit Type: OFFICE VISIT  Date: 11/26/2022  Medication: test   Has the patient contacted their pharmacy? Test  (Agent: If no, request that the patient contact the pharmacy for the refill. If patient does not wish to contact the pharmacy document the reason why and proceed with request.) (Agent: If yes, when and what did the pharmacy advise?)  Is this the correct pharmacy for this prescription? Test  If no, delete pharmacy and type the correct one.  This is the patient's preferred pharmacy:  [id number] [place] [place] - [address] [place] [address] Phone: [phone] Fax: [phone]   Has the prescription been filled recently? Test   Is the patient out of the medication? Test   Has the patient been seen for an appointment in the last year OR does the patient have an upcoming appointment? Test   Can we respond through MyChart? Test   Agent: Please be advised that Rx refills may take up to 3 business days. We ask that you follow-up with your pharmacy.

## 2023-05-07 ENCOUNTER — Telehealth: Payer: Self-pay

## 2023-05-07 NOTE — Transitions of Care (Post Inpatient/ED Visit) (Signed)
 05/07/2023  Name: Dominique Stevens MRN: [id number] DOB: Jun 15, 1975  Today's TOC FU Call Status: Today's TOC FU Call Status:: Successful TOC FU Call Completed TOC FU Call Complete Date: 05/07/23 Patient's Name and Date of Birth confirmed.  Transition Care Management Follow-up Telephone Call Date of Discharge: 05/06/23 Discharge Facility: Other ([facility]) Name of Other ([facility]) Discharge Facility: [facility] Type of Discharge: Inpatient Admission Primary Inpatient Discharge Diagnosis:: stroke How have you been since you were released from the hospital?: Same Any questions or concerns?: No  Items Reviewed: Did you receive and understand the discharge instructions provided?: Yes Medications obtained,verified, and reconciled?: Yes (Medications Reviewed) Any new allergies since your discharge?: No Dietary orders reviewed?: Yes Do you have support at home?: Yes People in Home [RPT]: parent(s)  Medications Reviewed Today: Medications Reviewed Today     Reviewed by Darrall Ellison, LPN (Licensed Practical Nurse) on 05/07/23 at [phone]  Med List Status: <None>   Medication Order Taking? Sig Documenting Provider Last Dose Status Informant  ALPRAZolam  (XANAX ) 0.5 MG tablet [id number] Yes Take 1 tablet (0.5 mg total) by mouth 2 (two) times daily as needed for anxiety. Adelia Homestead, MD Taking Active   benzonatate  (TESSALON ) 200 MG capsule [id number]  Take 1 capsule (200 mg total) by mouth 3 (three) times daily as needed.  Patient not taking: Reported on 11/26/2022   Adelia Homestead, MD  Active   chlorpheniramine-HYDROcodone  (TUSSIONEX) 10-8 MG/5ML [id number] No Take 5 mLs by mouth every 12 (twelve) hours as needed for cough.  Patient not taking: Reported on 05/07/2023   Adelia Homestead, MD Not Taking Active   escitalopram  (LEXAPRO ) 20 MG tablet [id number]  TAKE 1 TABLET BY MOUTH EVERY DAY Adelia Homestead, MD  Active   esomeprazole  (NEXIUM ) 40 MG capsule [id number] Yes  TAKE 1 CAPSULE (40 MG TOTAL) BY MOUTH DAILY AT 12 NOON. Adelia Homestead, MD Taking Active   Estradiol -Norethindrone  Acet 0.5-0.1 MG tablet [id number] Yes TAKE 1 TABLET BY MOUTH EVERY DAY Adelia Homestead, MD Taking Active   HYDROcodone  bit-homatropine [facility]) 5-1.5 MG/5ML syrup [id number]  Take 5 mLs by mouth every 8 (eight) hours as needed for cough.  Patient not taking: Reported on 11/26/2022   Adelia Homestead, MD  Active   Loratadine (CLARITIN PO) [id number] Yes Take by mouth. [provider] Taking Active   nystatin  cream (MYCOSTATIN ) [id number] Yes APPLY TO AFFECTED AREA TWICE A DAY Adelia Homestead, MD Taking Active             Home Care and Equipment/Supplies: Were Home Health Services Ordered?: NA Any new equipment or medical supplies ordered?: Yes Name of Medical supply agency?: unknown Were you able to get the equipment/medical supplies?: Yes Do you have any questions related to the use of the equipment/supplies?: No  Functional Questionnaire: Do you need assistance with bathing/showering or dressing?: Yes Do you need assistance with meal preparation?: Yes Do you need assistance with eating?: No Do you have difficulty maintaining continence: No Do you need assistance with getting out of bed/getting out of a chair/moving?: No Do you have difficulty managing or taking your medications?: No  Follow up appointments reviewed: PCP Follow-up appointment confirmed?: Yes Date of PCP follow-up appointment?: 05/14/23 Follow-up Provider: [facility] Follow-up appointment confirmed?: No Reason Specialist Follow-Up Not Confirmed: Patient has Specialist Provider Number and will Call for Appointment Do you need transportation to your follow-up appointment?: No Do you understand care options if your condition(s) worsen?: Yes-patient verbalized understanding  SIGNATURE Darrall Ellison, LPN [facility] Nurse Health Advisor Direct Dial  [phone]

## 2023-05-10 ENCOUNTER — Encounter: Payer: Self-pay | Admitting: Internal Medicine

## 2023-05-10 ENCOUNTER — Ambulatory Visit: Admitting: Internal Medicine

## 2023-05-10 VITALS — BP 142/80 | HR 96 | Temp 98.6°F | Ht 66.0 in

## 2023-05-10 DIAGNOSIS — F419 Anxiety disorder, unspecified: Secondary | ICD-10-CM

## 2023-05-10 DIAGNOSIS — R051 Acute cough: Secondary | ICD-10-CM

## 2023-05-10 DIAGNOSIS — R030 Elevated blood-pressure reading, without diagnosis of hypertension: Secondary | ICD-10-CM

## 2023-05-10 DIAGNOSIS — I6783 Posterior reversible encephalopathy syndrome: Secondary | ICD-10-CM

## 2023-05-10 MED ORDER — ALPRAZOLAM 0.5 MG PO TABS
0.5000 mg | ORAL_TABLET | Freq: Three times a day (TID) | ORAL | 5 refills | Status: DC | PRN
Start: 2023-05-10 — End: 2023-07-29

## 2023-05-10 MED ORDER — PAROXETINE HCL 20 MG PO TABS: 20.0000 mg | ORAL_TABLET | Freq: Every day | ORAL | 1 refills | Status: DC

## 2023-05-10 MED ORDER — PROPRANOLOL HCL ER 60 MG PO CP24: 60.0000 mg | ORAL_CAPSULE | Freq: Every day | ORAL | 1 refills | Status: DC

## 2023-05-10 MED ORDER — ALPRAZOLAM 0.5 MG PO TABS: 0.5000 mg | ORAL_TABLET | Freq: Three times a day (TID) | ORAL | 5 refills | Status: DC | PRN

## 2023-05-10 NOTE — Patient Instructions (Signed)
 You did not have a stroke.  My best guess is high blood pressure (PRES) or viral meningitis (a viral infection that can get in the spinal fluid).   We will recheck the liver numbers in about 1 month and check a CT scan of the chest to check the aorta for size.   We have sent in the paxil to take 1/2 pill daily for 1 week then increase to 1 pill daily. We have sent in alprazolam  to take 1 pill 3 times a day.

## 2023-05-10 NOTE — Progress Notes (Signed)
   Subjective:   Patient ID: Dominique Stevens, female    DOB: 03-Oct-1975, 48 y.o.   MRN: [id number]  HPI The patient is a 48 YO female coming in for hospital follow up (admitted to [facility] for stroke like symptoms, had CT and MRI and CTA with no evidence of stroke, she had elevated BP and had been sick week prior to admission). She has residual weakness and speech changes. She is getting exhausted doing normal activities and cannot go down stairs. She denies fevers or chills. Cold symptoms have improved. She is very confused about the stay information was not great and she was told different things.   PMH, fMH, social history reviewed and updated  Review of Systems  Constitutional:  Positive for activity change and fatigue.  HENT: Negative.    Eyes: Negative.   Respiratory:  Negative for cough, chest tightness and shortness of breath.   Cardiovascular:  Negative for chest pain, palpitations and leg swelling.  Gastrointestinal:  Negative for abdominal distention, abdominal pain, constipation, diarrhea, nausea and vomiting.  Musculoskeletal:  Positive for gait problem.  Skin: Negative.   Neurological:  Positive for speech difficulty and weakness.  Psychiatric/Behavioral:  Positive for decreased concentration, dysphoric mood and sleep disturbance. The patient is nervous/anxious.     Objective:  Physical Exam Constitutional:      Appearance: She is well-developed.  HENT:     Head: Normocephalic and atraumatic.  Cardiovascular:     Rate and Rhythm: Normal rate and regular rhythm.  Pulmonary:     Effort: Pulmonary effort is normal. No respiratory distress.     Breath sounds: Normal breath sounds. No wheezing or rales.  Abdominal:     General: Bowel sounds are normal. There is no distension.     Palpations: Abdomen is soft.     Tenderness: There is no abdominal tenderness. There is no rebound.  Musculoskeletal:     Cervical back: Normal range of motion.  Skin:    General: Skin is  warm and dry.  Neurological:     Mental Status: She is alert and oriented to person, place, and time.     Sensory: Sensory deficit present.     Motor: Weakness present.     Coordination: Coordination abnormal.     Comments: Speech normal content not fluid and some delays      Vitals:   05/10/23 1311 05/10/23 1329  BP: (!) 142/80 (!) 142/80  Pulse: 96   Temp: 98.6 F (37 C)   TempSrc: Oral   SpO2: 99%   Height: 5\' 6"  (1.676 m)     Assessment & Plan:  Visit time 25 minutes in face to face communication with patient and coordination of care, additional 15 minutes spent in record review, coordination or care, ordering tests, communicating/referring to other healthcare professionals, documenting in medical records all on the same day of the visit for total time 40 minutes spent on the visit.

## 2023-05-11 ENCOUNTER — Telehealth: Payer: Self-pay

## 2023-05-11 NOTE — Telephone Encounter (Signed)
 I'm not sure this is enough information to help. Can you clarify further?

## 2023-05-11 NOTE — Telephone Encounter (Signed)
 This was a error. Disregard patient was able to get both of her prescriptions filled at her pharmacy

## 2023-05-14 ENCOUNTER — Encounter: Payer: Self-pay | Admitting: Internal Medicine

## 2023-05-14 ENCOUNTER — Ambulatory Visit: Admitting: Internal Medicine

## 2023-05-14 DIAGNOSIS — I6783 Posterior reversible encephalopathy syndrome: Secondary | ICD-10-CM | POA: Insufficient documentation

## 2023-05-14 NOTE — Assessment & Plan Note (Signed)
 Improving and was likely viral.

## 2023-05-14 NOTE — Assessment & Plan Note (Signed)
 She was discharged on propranolol  20 mg TID and we have converted this to propranolol  60 mg daily. New rx done.

## 2023-05-14 NOTE — Assessment & Plan Note (Signed)
 She is worse on anxiety after this hospital stay. Has been successful with paxil  previously. Will stop lexapro  (which she was not taking) and start taking 10 mg daily for 1 week then increase to 20 mg daily. She will continue alprazolam  up to TID prn (temporary increase due to stress). Follow up 1 month.

## 2023-05-14 NOTE — Assessment & Plan Note (Signed)
 My suspicion of her symptoms would be PRES most likely with neurological signs and speech impairment with high BP readings. The alternative is a mild viral meningitis (she had some non-specific signs of inflammation on the MRI and had preceeding viral illness). She is still impaired with activity and balance and strength and speech. Referral to neurorehab for PT/Ot/SLP to help with recovery. We discussed if viral meningitis the recovery could be more prolonged. With PRES we would expect quicker recovery. She did not have evidence of stroke on any imaging in the hospital although she was told this. We reviewed this in detail.

## 2023-05-19 ENCOUNTER — Ambulatory Visit: Attending: Internal Medicine | Admitting: Physical Therapy

## 2023-05-19 ENCOUNTER — Other Ambulatory Visit: Payer: Self-pay

## 2023-05-19 ENCOUNTER — Encounter: Payer: Self-pay | Admitting: Physical Therapy

## 2023-05-19 ENCOUNTER — Ambulatory Visit

## 2023-05-19 VITALS — BP 140/120 | HR 77

## 2023-05-19 DIAGNOSIS — R2681 Unsteadiness on feet: Secondary | ICD-10-CM | POA: Diagnosis present

## 2023-05-19 DIAGNOSIS — R41841 Cognitive communication deficit: Secondary | ICD-10-CM | POA: Diagnosis present

## 2023-05-19 DIAGNOSIS — R4701 Aphasia: Secondary | ICD-10-CM | POA: Diagnosis present

## 2023-05-19 DIAGNOSIS — M6281 Muscle weakness (generalized): Secondary | ICD-10-CM | POA: Insufficient documentation

## 2023-05-19 DIAGNOSIS — R269 Unspecified abnormalities of gait and mobility: Secondary | ICD-10-CM | POA: Insufficient documentation

## 2023-05-19 NOTE — Therapy (Signed)
 OUTPATIENT SPEECH LANGUAGE PATHOLOGY EVALUATION   Patient Name: Dominique Stevens MRN: [id number] DOB:26-Jun-1975, 48 y.o., female Today's Date: 05/19/2023  PCP: Adelia Homestead, MD REFERRING PROVIDER: Adelia Homestead, MD  END OF SESSION:  End of Session - 05/19/23 0754     Visit Number 1    Number of Visits 5    Date for SLP Re-Evaluation 07/14/23   extended for scheduling   Authorization Type [employer]    SLP Start Time 0800    SLP Stop Time  0845    SLP Time Calculation (min) 45 min    Activity Tolerance Patient tolerated treatment well             Past Medical History:  Diagnosis Date   Anxiety    GERD (gastroesophageal reflux disease)    Hypertension    Kidney stones    Past Surgical History:  Procedure Laterality Date   ABLATION     BREAST BIOPSY Right 10/21/2021   BREAST BIOPSY  12/04/2021   MM RT RADIOACTIVE SEED LOC MAMMO GUIDE 12/04/2021 [facility]MO[facility]AP[facility]   BREAST LUMPECTOMY WITH RADIOACTIVE SEED LOCALIZATION Right 12/05/2021   Procedure: RIGHT BREAST LUMPECTOMY WITH RADIOACTIVE SEED LOCALIZATION;  Surgeon: Caralyn Chandler, MD;  Location: [facility];  Service: General;  Laterality: Right;   CHOLECYSTECTOMY     TUBAL LIGATION     Patient Active Problem List   Diagnosis Date Noted   PRES (posterior reversible encephalopathy syndrome) 05/14/2023   Acute cough 11/24/2022   Breast pain 12/25/2021   GERD (gastroesophageal reflux disease) 09/25/2021   Anxiety 09/25/2021   Elevated blood pressure reading 09/25/2021   Hot flashes 09/25/2021   Routine general medical examination at a health care facility 09/25/2021   Nicotine abuse 09/25/2021    ONSET DATE: 05/11/2023 (referral date)   REFERRING DIAG: I67.83 (ICD-10-CM) - Posterior reversible encephalopathy syndrome  THERAPY DIAG:  Aphasia  Cognitive communication deficit  Rationale for Evaluation and Treatment: Rehabilitation  SUBJECTIVE:   SUBJECTIVE STATEMENT: "I can see the  word in my head but I can't get it to come out of my mouth"  Pt accompanied by: self  PERTINENT HISTORY: "The patient is a 48 YO female coming in for hospital follow up (admitted to [facility] for stroke like symptoms, had CT and MRI and CTA with no evidence of stroke, she had elevated BP and had been sick week prior to admission). She has residual weakness and speech changes. She is getting exhausted doing normal activities and cannot go down stairs. She denies fevers or chills. Cold symptoms have improved. She is very confused about the stay information was not great and she was told different things.  My suspicion of her symptoms would be PRES most likely with neurological signs and speech impairment with high BP readings. The alternative is a mild viral meningitis (she had some non-specific signs of inflammation on the MRI and had preceeding viral illness). She is still impaired with activity and balance and strength and speech. Referral to neurorehab for PT/Ot/SLP to help with recovery. We discussed if viral meningitis the recovery could be more prolonged. With PRES we would expect quicker recovery. She did not have evidence of stroke on any imaging in the hospital although she was told this. We reviewed this in detail."  PAIN: Are you having pain? No  FALLS: Has patient fallen in last 6 months?  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  COGNITION: Overall cognitive status: Within functional limits for tasks assessed Comments: denied cognitive changes; exhibits some delayed processing speed and possible mild attention changes  indicated  AUDITORY COMPREHENSION: Overall auditory comprehension: Appears intact for tasks assessed Comments: processing speed and attention may be contributing factor  READING COMPREHENSION: reported need for some repetition but indicated improving comprehension  EXPRESSION: verbal  VERBAL EXPRESSION: Level of generative/spontaneous verbalization: conversation Automatic speech: name: intact and social response: intact  Repetition: Appears intact Naming: Confrontation: 76-100% Pragmatics: Appears intact Interfering components: attention, premorbid deficit, and delayed processing speed Effective technique: semantic cues, sentence completion, and phonemic cues Comments: Exhibited rare anomia/dysnomia during assessment; none evidenced in conversation  WRITTEN EXPRESSION: Dominant hand: right - impaired (may benefit from OT evaluation)  MOTOR SPEECH: Overall motor speech: Appears intact - mild dysarthria secondary to dentures; at baseline  Effective technique: slow rate  ORAL MOTOR EXAMINATION: Overall status: WFL  STANDARDIZED ASSESSMENTS: [place] NAMING TEST: WNL(delays x4; self-correct with extra time x2)  PATIENT REPORTED OUTCOME MEASURES (PROM): Communication Participation Item Bank: 27  The Communicative Participation Item Bank        Does your condition interfere with... Pt Rating   ...talking with people you know 2   ...communicating when you need to say something quickly 2   ...talking with people you do not know 3   ...communicating when you are out in your community 3   ...asking questions in a conversation 3   ....communicating in a small group of people 3   ...having a long conversation 2   ...giving detailed infomrmation 3   ...getting your turn in a fast moving conversation 3   ...trying to persuade a friend or family member to see a different point of view 3  3= Not at all; 2=A little; 1=Quite a bit; 0=Very much                                                                                                                              TREATMENT DATE:  05/19/23: ST evaluation and POC complete. Initiated education and instruction of naming strategies and energy conservation strategies to aid performance at home. Pt verbalized understanding. Handout provided.    PATIENT EDUCATION: Education details: see above Person educated: Patient Education method: Explanation Education comprehension: verbalized understanding and needs further education   GOALS: Goals reviewed with patient? Yes  SHORT TERM GOALS: Target date: 06/16/2023  Pt will employ word retrieval strategies in therapy with rare min A Baseline: Goal status: INITIAL  2.  Pt will employ attention/processing strategies in therapy given rare min A  Baseline:  Goal status: INITIAL   LONG TERM GOALS: Target date: 07/14/2023  Pt will carryover word retrieval strategies with mod  I > 1 week Baseline:  Goal status: INITIAL  2.  Pt will carryover attention/processing strategies at home with mod I > 1 week Baseline:  Goal status: INITIAL  3.  Pt will report improved communication functioning via PROM by 2 points by LTG date Baseline: CPIB=27 Goal status: INITIAL    ASSESSMENT:  CLINICAL IMPRESSION: Patient is a 48 y.o. F who was seen today for ST evaluation for PRES. Endorsed improvements in communication since hospital discharge. Today, pt presents with rare anomia and dysnomia during assessment with none evidenced in conversation. Demonstrated and reports adequate cognitive functioning despite mild delays in processing speed and potential mild attention deficits (I.e. endorsing getting side tracked more frequently). Benefited from extra processing time for naming and error correction. Minimal dysarthria observed, which pt reported baseline secondary to upper dentures. Endorsed speech mildly declines when fatigued. Pt endorsed current communication as 7-8/10 with 10 being baseline. Pt  would benefit from short course of ST intervention to maximize communication effectiveness and optimize return to baseline.   OBJECTIVE IMPAIRMENTS: include attention and expressive language. These impairments are limiting patient from effectively communicating at home and in community. Factors affecting potential to achieve goals and functional outcome are cooperation/participation level and previous level of function. Patient will benefit from skilled SLP services to address above impairments and improve overall function.  REHAB POTENTIAL: Good  PLAN:  SLP FREQUENCY: 1x/week  SLP DURATION: 8 weeks (extended for scheduling)  PLANNED INTERVENTIONS: Language facilitation, Environmental controls, Cueing hierachy, Internal/external aids, Multimodal communication approach, SLP instruction and feedback, Compensatory strategies, Patient/family education, and [id number] Treatment of speech (30 or 45 min)     Tamar Fairly, CCC-SLP 05/19/2023, 9:47 AM

## 2023-05-19 NOTE — Patient Instructions (Signed)

## 2023-05-19 NOTE — Therapy (Unsigned)
 OUTPATIENT PHYSICAL THERAPY NEURO EVALUATION   Patient Name: Dominique Stevens MRN: [id number] DOB:Jan 04, 1976, 48 y.o., female Today's Date: 05/20/2023   PCP: Adelia Homestead, MD REFERRING PROVIDER: Adelia Homestead, MD  END OF SESSION:  PT End of Session - 05/19/23 0849     Visit Number 1    Number of Visits 9    Date for PT Re-Evaluation 07/14/23   POC written out to allow time for patient to follow up about BP management   Authorization Type [employer]    PT Start Time [id number]    PT Stop Time 0920    PT Time Calculation (min) 34 min    Equipment Utilized During Treatment Gait belt    Activity Tolerance Other (comment);Treatment limited secondary to medical complications (Comment)   elevated BP   Behavior During Therapy Anxious   very anxious and requires frequent redirection of tasks            Past Medical History:  Diagnosis Date   Anxiety    GERD (gastroesophageal reflux disease)    Hypertension    Kidney stones    Past Surgical History:  Procedure Laterality Date   ABLATION     BREAST BIOPSY Right 10/21/2021   BREAST BIOPSY  12/04/2021   MM RT RADIOACTIVE SEED LOC MAMMO GUIDE 12/04/2021 [facility]MO[facility]   BREAST LUMPECTOMY WITH RADIOACTIVE SEED LOCALIZATION Right 12/05/2021   Procedure: RIGHT BREAST LUMPECTOMY WITH RADIOACTIVE SEED LOCALIZATION;  Surgeon: Caralyn Chandler, MD;  Location: [facility];  Service: General;  Laterality: Right;   CHOLECYSTECTOMY     TUBAL LIGATION     Patient Active Problem List   Diagnosis Date Noted   PRES (posterior reversible encephalopathy syndrome) 05/14/2023   Acute cough 11/24/2022   Breast pain 12/25/2021   GERD (gastroesophageal reflux disease) 09/25/2021   Anxiety 09/25/2021   Elevated blood pressure reading 09/25/2021   Hot flashes 09/25/2021   Routine general medical examination at a health care facility 09/25/2021   Nicotine abuse 09/25/2021    ONSET DATE: 05/10/2023 (referral date)  REFERRING  DIAG: I67.83 (ICD-10-CM) - PRES (posterior reversible encephalopathy syndrome)  THERAPY DIAG:  Abnormality of gait and mobility - Plan: PT plan of care cert/re-cert  Unsteadiness on feet - Plan: PT plan of care cert/re-cert  Muscle weakness (generalized) - Plan: PT plan of care cert/re-cert  Rationale for Evaluation and Treatment: Rehabilitation  SUBJECTIVE:  SUBJECTIVE STATEMENT: Patient arrives to session with [name] with heavy R hemiparetic gait with extensor synergy on LE and flexion synergy of UE. Patient reports going to the hospital on 4/29 with BP of "200/140 or something like that." Patient was told that she likely had a stroke but imaging came back clear. Patient was told by primary care doctor that there was no sign of a stoke but more probable would be PRES vs mild viral meningitis with PRES being more probable. Patient reports that she had two virus back to back many months ago but was not immediately symptomatic but a few weeks later, started with an onset of R side weakness to point could not longer walk. Patient reports going to the hospital as she was not able to move her leg and R side of body. She was initially in a wheelchair due to her symptoms being so bad but started using the cane following PT at the hospital as she was unable to use a walker in her R arm. Patient did not have inpatient rehab or home health physical therapy. Patient reports that she does feel like her strength is improving. Patient reports being very anxious and overwhelmed. She did not have inpatient rehab as it was "too far away" or home health PT. She reports one fall since getting home. She had no idea that viral meningitis can be contagious or that her doctor thought she might have PRES as she initially tells PT that she was  told she had viral meningitis.   Pt accompanied by: self and was driven by parents   PERTINENT HISTORY: Posterior reversible encephalopathy syndrome (PRES) vs. mild viral meningitis, anxiety  PAIN:  Are you having pain? Yes: NPRS scale: R side of body Pain location: R arm and down R leg Pain description: achy Aggravating factors: unknown Relieving factors: unknown  PRECAUTIONS: Fall and Other: viral meningitis possible though considered less likely- PT before beginning eval talked with supervisor about precautions for viral meningitis given time since onset of symptoms and advisor recommended patient don mask and gloves which was done during session and use bleach wipes at end of session, supervisor will also reach out to  and safety for any further future recommendations   Patient very anxious and requires frequent redirection   RED FLAGS: None   WEIGHT BEARING RESTRICTIONS: No  FALLS: Has patient fallen in last 6 months? Yes. 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  DIAGNOSTIC FINDINGS:    COGNITION: Overall cognitive status: grossly WFL but very anxious and requires redirection   SENSATION: WFL  COORDINATION:  TBA  EDEMA:  None  MUSCLE TONE: R hemibody to be assessed more formally in future session   TRANSFERS: Sit to stand: SBA  Assistive device utilized: Single point cane     Stand to sit: SBA  Assistive device  utilized: Single point cane     Chair to chair: CGA  Assistive device utilized: Single point cane  Uses UE, cannot clear R leg off ground and lacking approximately 10 degrees of knee extension on R functionally with gait with transfers   GAIT: Findings: Gait Characteristics: step through pattern, decreased hip/knee flexion- Right, decreased ankle dorsiflexion- Right, and Right hip hike, Distance walked: ~60-80 feet from SLP to first treatment room, patient barely able to make it as drags R LE due to degree of hemiparesis on this side, with one standing rest break, Assistive device utilized:Single point cane, Level of assistance: CGA, and Comments: increased knee flexion on RLE in stance due to functional lack of knee extension but lacking hip and knee flexion through swing fulling dragging foot without leg clearance, short step on the R side and unsteady, cane providing minimal support and PT with strong concern for falls    FUNCTIONAL TESTS:  Deferred due to very elevated BP readings   PATIENT SURVEYS:  Patient Specific Function Scale: Walking: 6/10 VITALS: Vitals:   05/19/23 0919 05/19/23 0922  BP: (!) 155/123 (!) 140/120  Pulse: 80 77  Assessed seated on LUE                                                                                                                    TREATMENT:   Self Care: PT with strong concern for patient given degree of anxiety and lack of prior medical support up to this point (no inpatient rehab) given major change in physical function, PT advises patient go to ED given elevated BP readings and when patient declines recommends to immediately call PCP to get in given readings today or for further recommendation, emphasizes concern and safety risk with readings this elevated for diastolic and informs of safe therapeutic ranges, Patient to call back to schedule; given elevated BP readings and difficulty with gait, patient in agreement to use transport chair out  of clinic (transfers with SBA)  Prior to discovery of elevated BP readings, PT discussed OT referral as well as patient R arm in flexion synergy due to R hemiparesis and patient expresses concern about arm function, patient becomes very overwhelmed by adding another discipline but then states if PT would recommend open to PT reaching out about referral  PT educates patient on why PT donned mask and gloves as per supervisors recommendations given possibility of viral meningitis, PT very anxious as she states no one told her this could be contagious, PT explains that PCP mentioned PRES being more probable but PT would follow recommendations of supervisor for extra precautions, patient also unaware of PRES being a possibility and PT explained in short that medical team still trying to decide primary cause of symptoms at this time and upcoming neurology visit will likely add clarity, PT explains that per PCP recovery time for PRES typically shorter than viral meningitis but as diagnosis still unknown difficult to provide prognosis for return to function today  PATIENT EDUCATION: Education details: POC, self care session above, goal collaboration  Person educated: Patient Education method: Explanation Education comprehension: verbalized understanding and needs  further education  HOME EXERCISE PROGRAM: To be provided   GOALS: Goals reviewed with patient? Yes  SHORT TERM GOALS: Target date: 06/09/2023  Patient will report demonstrate independence with initial HEP in order to maintain current gains and continue to progress after physical therapy discharge.   Baseline: To be provided  Goal status: INITIAL  2.  to be assessed / LTG to be written  Baseline: To be assessed  Goal status: INITIAL  3.  FxSTS to be assessed / LTG to be written Baseline: To be assessed  Goal status: INITIAL  4.  TUG to be assessed /  LTG to be written  Baseline: To be assessed  Goal status: INITIAL   LONG  TERM GOALS: Target date: 07/14/2023  Patient will report demonstrate independence with final HEP in order to maintain current gains and continue to progress after physical therapy discharge.  Baseline: To be provided  Goal status: INITIAL  2.  to be assessed / LTG to be written  Baseline: To be assessed  Goal status: INITIAL  3.  FxSTS to be assessed / LTG to be written Baseline: To be assessed  Goal status: INITIAL  4.  TUG to be assessed /  LTG to be written  Baseline: To be assessed  Goal status: INITIAL  5.  Patient will reports improvement on Patient Specific Functional Scale by MCID to indicate meaningful change in walking function. Baseline: 6/10  Goal status: D/C due to limited room to improve by 3 points based on deficits noted today  ASSESSMENT:  CLINICAL IMPRESSION: Patient is a 48 y.o. female who was seen today for physical therapy evaluation and treatment for abnormality of gait and mobility that presents in the form of R hemiparetic gait suspected to be most likely due to posterior reversible encephalopathy syndrome (PRES) with secondary consideration of mild viral meningitis though functionally presents more similarly to L side CVA. Eval was high complexity given patient uncontrolled BP as well as high levels of anxiety and lack of previously more extensive therapy interventions (inpatient rehab as far as PT able to tell) given degree of deficits and change from PLOF. Patient currently using SPC to ambulate between SLP and PT and is unable to achieve limb clearance on RLE due to suspected weakness in hip flexors vs extensor tone. SPC also appears to provide limited stability and patient requires 1x standing rest break during walk. Patient would strongly benefit from OT referral as well given degree of RUE involvement. Behavior health or neuro psych may be indicated as well given degree of anxiety impacting today's session. Remainder of functional screening/testing to be  completed in future once BP under control. If able to achieve medical stability, patient will benefit from skilled physical therapy services to address deficits.   OBJECTIVE IMPAIRMENTS: Abnormal gait, decreased activity tolerance, decreased balance, decreased cognition, decreased endurance, decreased knowledge of use of DME, difficulty walking, decreased ROM, decreased strength, impaired tone, impaired UE functional use, and pain.   ACTIVITY LIMITATIONS: carrying, lifting, bending, sitting, standing, squatting, stairs, transfers, locomotion level, and caring for others  PARTICIPATION LIMITATIONS: meal prep, cleaning, laundry, driving, and community activity  PERSONAL FACTORS: Age, Past/current experiences, Transportation, and 1-2 comorbidities: see above are also affecting patient's functional outcome.   REHAB POTENTIAL: Fair high levels of anxiety, elevated BP readings   CLINICAL DECISION MAKING: Unstable/unpredictable  EVALUATION COMPLEXITY: High  PLAN:  PT FREQUENCY: 2x/week  PT DURATION: 4 weeks (POC end date written out further to allow time  for patient to follow up with doctor for BP management)   PLANNED INTERVENTIONS: 97164- PT Re-evaluation, 97750- Physical Performance Testing, 97110-Therapeutic exercises, 97530- Therapeutic activity, 97112- Neuromuscular re-education, 97535- Self Care, 16109- Manual therapy, [id number]- Gait training, [phone]- Aquatic Therapy, and Dry Needling  PLAN FOR NEXT SESSION:  Please read all below for plan details *Follow up with Hosey Macadam about any changes to precautions given possibility of viral meningitis (see precaution section for previous recommendations)  To be assessed: majority of neuro eval - very limited by patient anxiety/BP readings last session (may require behavior health referral or neuro psych), monitor vitals  TUG 5xSTS  R NMR with emphasis on gait training and appropriate AD recommendation (SBQC vs hemiwalker trial vs current  SPC), likely need AFO or at minimum toe cap unless major return on RLE, OT referral?, will need to schedule out as indicated   Coreen Devoid, PT, DPT 05/20/2023, 8:38 AM

## 2023-05-20 ENCOUNTER — Telehealth: Payer: Self-pay | Admitting: Physical Therapy

## 2023-05-20 ENCOUNTER — Telehealth: Admitting: Nurse Practitioner

## 2023-05-20 ENCOUNTER — Encounter: Payer: Self-pay | Admitting: Internal Medicine

## 2023-05-20 DIAGNOSIS — I6783 Posterior reversible encephalopathy syndrome: Secondary | ICD-10-CM

## 2023-05-20 MED ORDER — LOSARTAN POTASSIUM 25 MG PO TABS: 25.0000 mg | ORAL_TABLET | Freq: Every day | ORAL | 0 refills | Status: DC

## 2023-05-20 NOTE — Assessment & Plan Note (Signed)
 Chronic At home blood pressure readings are labile.  Per shared decision making we will add losartan 25 mg by mouth every morning.  She will return to office in around 10 days to have metabolic panel checked.  She has appointment for follow-up with PCP in about 2 weeks and plans to keep that appointment.  She was told of potential side effects related to losartan and what to do if they were to occur. Follow-up with PCP as scheduled, she reports understanding.

## 2023-05-20 NOTE — Patient Instructions (Signed)
 Reversible Posterior Encephalopathy Syndrome

## 2023-05-20 NOTE — Telephone Encounter (Signed)
 Spoke with patient and see wanted to be seen with MD but advised her that MD was out office and she was ok with seeing someone else I ave added her to NP gray scheduled just to be advised about recent PT notes stating she has swelling on the brain.

## 2023-05-20 NOTE — Telephone Encounter (Signed)
 Dr. Nicolette Barrio,  Dominique Stevens  was evaluated by PT on 05/20/2023.  The patient would benefit from OT evaluation for impaired UE function (presents in RUE flexion synergy).    If you agree, please place an order in [facility] workque in [facility] or fax the order to [phone].  Thank you, Camella Cave, PT, DPT  [facility] [address] [place], [place]  [address] Phone:  [phone] Fax:  [phone]

## 2023-05-20 NOTE — Progress Notes (Signed)
 Established Patient Office Visit  An audio/visual tele-health visit was completed today for this patient. I connected with  Dominique Stevens on 05/20/23 utilizing audio/visual technology and verified that I am speaking with the correct person using two identifiers. The patient was located at their home, and I was located at the office of [facility] at [facility] during the encounter. I discussed the limitations of evaluation and management by telemedicine. The patient expressed understanding and agreed to proceed.   Subjective   Patient ID: Dominique Stevens, female    DOB: 08/30/1975  Age: 48 y.o. MRN: [id number]  Chief Complaint  Patient presents with   Medical Management of Chronic Issues    About stroke after effect( she was told this during her ED visit), she was told by Dr. Nicolette Barrio there is no evidence of stroke. Her PT stated that she has brain swelling and BP is high     PRES/HTN: Patient has virtual visit for the above.  She was recently hospitalized about 2 weeks ago for complicated migraine, hypertensive emergency.  She presented to [facility] with headache x 1 day and significant anxiety.  She was having severe insomnia as well and had not slept well for the 5 days prior to presentation.  In addition to headache she was experiencing paresthesias in right hand and decreased visual acuity.  Blood pressure upon presentation was between 190-200 systolically.  She also started experiencing right arm and leg weakness as well as visual field changes that she describes stars.   Overall workup ruled out stroke but functional neurologic disorder has not been able to be ruled out.  She was started on propranolol  for management of hypertension and severe anxiety.   Since being discharged she has been try to work with physical therapy because she reports she still feels a bit weak and deconditioned since being discharged.  Last time she saw physical therapy they told her but her blood  pressure was too high and they were not able to work with her that day.  They had also mentioned that she had swelling on the brain which has caused her anxiety to worsen.   She arrives today to discuss further.  Per chart review MRI of head does not mention any evidence of swelling or elevated intracranial pressures.  She reports her at home blood pressure readings are ranging from 100-151/100s.  She is current taking propranolol  60 mg extended release by mouth at night.    ROS: see HPI    Objective:     There were no vitals taken for this visit.   Physical Exam Comprehensive physical exam not completed today as office visit was conducted remotely.  Patient appears well on video, no obvious neurologic deficits identified on video however unable to assess strength in sensation over video today.  Patient was alert and oriented, and appeared to have appropriate judgment.   No results found for any visits on 05/20/23.    The 10-year ASCVD risk score (Arnett DK, et al., 2019) is: 6.7%    Assessment & Plan:   Problem List Items Addressed This Visit       Nervous and Auditory   PRES (posterior reversible encephalopathy syndrome) - Primary   Chronic At home blood pressure readings are labile.  Per shared decision making we will add losartan 25 mg by mouth every morning.  She will return to office in around 10 days to have metabolic panel checked.  She has appointment for follow-up with PCP  in about 2 weeks and plans to keep that appointment.  She was told of potential side effects related to losartan and what to do if they were to occur. Follow-up with PCP as scheduled, she reports understanding.      Relevant Medications   losartan (COZAAR) 25 MG tablet   Other Relevant Orders   Basic metabolic panel with GFR    Return in about 2 weeks (around 06/03/2023) for as scheduled with PCP.    Zorita Hiss, NP

## 2023-05-21 NOTE — Telephone Encounter (Signed)
 Agree to add OT

## 2023-05-24 ENCOUNTER — Telehealth: Payer: Self-pay | Admitting: Internal Medicine

## 2023-05-24 NOTE — Telephone Encounter (Signed)
 Patient self-scheduled via MyChart for a virtual visit regarding her blood pressure. I called to inform her that she would need to come in regarding blood pressure and she said it was regarding her medication that she was prescribed for it. She said she was having issues with the medication working. She repeatedly refused to come into the office and when told she would need to,she hung up.

## 2023-05-25 NOTE — Telephone Encounter (Signed)
 I would need more information to help clarify. Can we find out BP readings and what questions patient has about this.

## 2023-05-26 ENCOUNTER — Encounter: Payer: Self-pay | Admitting: Internal Medicine

## 2023-05-26 ENCOUNTER — Telehealth: Admitting: Internal Medicine

## 2023-05-26 ENCOUNTER — Telehealth (INDEPENDENT_AMBULATORY_CARE_PROVIDER_SITE_OTHER): Admitting: Internal Medicine

## 2023-05-26 DIAGNOSIS — I6783 Posterior reversible encephalopathy syndrome: Secondary | ICD-10-CM | POA: Diagnosis not present

## 2023-05-26 NOTE — Telephone Encounter (Signed)
 Called and lvm regarding this

## 2023-05-26 NOTE — Assessment & Plan Note (Signed)
 BP is still running high but is lower with addition of losartan  25 mg daily. She has taken about 5 days and still running mostly 150s/110s. We will increase to 50 mg daily and see her in person 06/08/23 for BP check and labs. Her speech is much improved today and still working with Pt (limited by high BP readings).

## 2023-05-26 NOTE — Progress Notes (Signed)
 Virtual Visit via Video Note  I connected with Dominique Stevens on 05/26/23 at 10:40 AM EDT by a video enabled telemedicine application and verified that I am speaking with the correct person using two identifiers.  The patient and the provider were at separate locations throughout the entire encounter. Patient location: home, Provider location: work   I discussed the limitations of evaluation and management by telemedicine and the availability of in person appointments. The patient expressed understanding and agreed to proceed. The patient and the provider were the only parties present for the visit unless noted in HPI below.  History of Present Illness: The patient is a 48 y.o. female with visit for BP problems. Started taking losartan  05/21/23. Has been having 140s/100s before. Since starting 130/90s mostly. A few readings of 140s/110s. Is having headaches. Taking losartan  in morning and propranolol  at night. This morning 146/118 before meds after meds similar 150/110s. Denies chest pain.   Observations/Objective: Appearance: normal, breathing appears normal, casual grooming, abdomen does not appear distended, memory normal, speech more normal than prior visit more fluid, some weakness in arms/legs, mental status is A and O times 3  Assessment and Plan: See problem oriented charting  Follow Up Instructions: increase losartan  to 50 mg daily, has follow up visit 06/08/23 for BP recheck and labs  I discussed the assessment and treatment plan with the patient. The patient was provided an opportunity to ask questions and all were answered. The patient agreed with the plan and demonstrated an understanding of the instructions.   The patient was advised to call back or seek an in-person evaluation if the symptoms worsen or if the condition fails to improve as anticipated.  Adelia Homestead, MD

## 2023-05-26 NOTE — Telephone Encounter (Signed)
 Patient has a virtual today with the provider and this was discussed

## 2023-05-31 ENCOUNTER — Encounter: Payer: Self-pay | Admitting: Internal Medicine

## 2023-06-02 ENCOUNTER — Encounter: Payer: Self-pay | Admitting: Internal Medicine

## 2023-06-02 ENCOUNTER — Other Ambulatory Visit (INDEPENDENT_AMBULATORY_CARE_PROVIDER_SITE_OTHER)

## 2023-06-02 DIAGNOSIS — I6783 Posterior reversible encephalopathy syndrome: Secondary | ICD-10-CM | POA: Diagnosis not present

## 2023-06-02 LAB — BASIC METABOLIC PANEL WITH GFR
BUN: 11 mg/dL (ref 6–23)
CO2: 31 meq/L (ref 19–32)
Calcium: 9.7 mg/dL (ref 8.4–10.5)
Chloride: 100 meq/L (ref 96–112)
Creatinine, Ser: 0.78 mg/dL (ref 0.40–1.20)
GFR: 90.41 mL/min (ref >60.00)
Glucose, Bld: 95 mg/dL (ref 70–99)
Potassium: 4.4 meq/L (ref 3.5–5.1)
Sodium: 137 meq/L (ref 135–145)

## 2023-06-02 MED ORDER — TEMAZEPAM 15 MG PO CAPS: 15.0000 mg | ORAL_CAPSULE | Freq: Every evening | ORAL | 0 refills | Status: DC | PRN

## 2023-06-02 MED ORDER — MAGNESIUM OXIDE -MG SUPPLEMENT 400 (240 MG) MG PO TABS: 400.0000 mg | ORAL_TABLET | Freq: Every day | ORAL | 3 refills | Status: DC

## 2023-06-02 NOTE — Telephone Encounter (Signed)
 I believe I have already sent you another message in regards to the medication

## 2023-06-02 NOTE — Telephone Encounter (Signed)
Appropriate to refill

## 2023-06-03 ENCOUNTER — Ambulatory Visit: Payer: Self-pay | Admitting: Nurse Practitioner

## 2023-06-07 ENCOUNTER — Other Ambulatory Visit: Payer: Self-pay

## 2023-06-07 ENCOUNTER — Encounter: Payer: Self-pay | Admitting: Internal Medicine

## 2023-06-07 ENCOUNTER — Other Ambulatory Visit: Payer: Self-pay | Admitting: Internal Medicine

## 2023-06-07 ENCOUNTER — Ambulatory Visit: Payer: Self-pay

## 2023-06-07 DIAGNOSIS — I6783 Posterior reversible encephalopathy syndrome: Secondary | ICD-10-CM

## 2023-06-07 MED ORDER — LOSARTAN POTASSIUM 50 MG PO TABS: 50.0000 mg | ORAL_TABLET | Freq: Every day | ORAL | 0 refills | Status: DC

## 2023-06-07 NOTE — Telephone Encounter (Signed)
 Pt called to report symptoms and request a refill. See Nurse Triage encounter.  Pt requests a 50mg  prescription to reflect her dosage change.

## 2023-06-07 NOTE — Telephone Encounter (Addendum)
 Chief Complaint: pain  Symptoms: R arm and leg pain Frequency: ongoing since she was discharged 4/29 Pertinent Negatives: Patient denies CP, SOB, H/A, new or worsening numbness or weakness, visual changes, unsteady gait, dizziness Disposition: [] ED /[] Urgent Care (no appt availability in office) / [x] Appointment(In office/virtual)/ []  [facility]/ [] Home Care/ [] Refused Recommended Disposition /[] [facility]/ []  Follow-up with PCP Additional Notes: Pt reports R arm and leg pain. Pt states she is unable to raise her R hand above her head. Pt states it is interrupting her sleep and rates the pain a 6/10. Pt report some weakness on that side but states it is improving and that she feels much stronger (with physical therapy). Pt endorses these symptoms since she was discharged 4/29. Pt states PT is waking up her nerves and causing pain. Pt using biofreeze with no relief.  Pt also reports her speech is "off." Pt states she was cleared by speech therapy but has a difficult time understanding speech. Pt states it is improving however. Pt's speech is not slurred on the phone. Pt has an appt for tomorrow. RN advised pt since she is having pain it would be most safe for her to be seen today but pt declined.  Pt states she is able to walk. Pt denies unsteady gait, CP, SOB, dizziness, H/A, vomiting, visual changes. Pt denies new or worsening numbness or weakness. Pt's biggest concern right now is the pain to her R side as well as her BP and being out of losartan . BP 139/101. Pt states she was recently put on 50mg  of losartan  (up from 25mg ) and states she is out. Pt took it last yesterday and needs a refill. Pt states she needs a new prescription for 50mg  for insurance purposes.  RN advised pt if she develops dizziness, unsteady gait, vomiting, H/A, new or worsening weakness/numbness, CP, or SOB she needs to call 911. Pt verbalized understanding.    Copied from CRM [id number]. Topic: Clinical  - Red Word Triage >> Jun 07, 2023 10:17 AM [place] wrote: Red Word that prompted transfer to Nurse Triage: Blood pressure reading today 139/101  . Does not have any medication left. Arm is in a lot of pain. Speech is off too. Not a lot of mobility in legs. Reason for Disposition  [1] Weakness of arm / hand, or leg / foot AND [2] is a chronic symptom (recurrent or ongoing AND present > 4 weeks)  Answer Assessment - Initial Assessment Questions 1. BLOOD PRESSURE: "What is the blood pressure?" "Did you take at least two measurements 5 minutes apart?"     139/101 2. ONSET: "When did you take your blood pressure?"     Today  3. HOW: "How did you take your blood pressure?" (e.g., automatic home BP monitor, visiting nurse)     Automatic cuff 4. HISTORY: "Do you have a history of high blood pressure?"     HTN 5. MEDICINES: "Are you taking any medicines for blood pressure?" "Have you missed any doses recently?"     Losartan  -- pt did not take today, states she has run out  6. OTHER SYMPTOMS: "Do you have any symptoms?" (e.g., blurred vision, chest pain, difficulty breathing, headache, weakness)  Denies H/A, CP, SOB, visual changes, weakness; states she can walk but her foot drags       States she was in the hospital for a week 4/29 with severe HTN  R-sided pain 6/10 - "I cannot get my hand to go over  my head", endorses pain in her R arm and leg -- pt states this is not new, "it's cramping like everything is waking up, it's disturbing my sleep a little bit". "Practicing is waking the nerves up." Denies worsening weakness, states she feels stronger. Pt states her speech is "off"; pt states speech therapy cleared her, trouble comprehending, "been dealing with it for a month" (speech sounds normal to RN)  "Pt wouldn't touch me because my BP was so high" (because she is hurting), "she told me to wait for a little bit, then it started getting more wacky so she doubled my losartan  from 25 to 50 on a  virtual visit, I ran out of the medication"  Answer Assessment - Initial Assessment Questions 1. SYMPTOM: "What is the main symptom you are concerned about?" (e.g., weakness, numbness)     R arm and leg pain; "I can't get my R hand to go over my head" 2. ONSET: "When did this start?" (minutes, hours, days; while sleeping)     4/29 - hospitalized 3. LAST NORMAL: "When was the last time you (the patient) were normal (no symptoms)?"     Ongoing since 4/29 4. PATTERN "Does this come and go, or has it been constant since it started?"  "Is it present now?"     Dull, constant pain; using biofreeze; pain is 6/10 5. CARDIAC SYMPTOMS: "Have you had any of the following symptoms: chest pain, difficulty breathing, palpitations?"     none 6. NEUROLOGIC SYMPTOMS: "Have you had any of the following symptoms: headache, dizziness, vision loss, double vision, changes in speech, unsteady on your feet?"     Weakness is improving (had weakness in the hospital, has been having PT), states she is feeling stronger;; denies other neurologic symptoms like visual changes, H/A, numbness, unsteady gait 7. OTHER SYMPTOMS: "Do you have any other symptoms?"     Speech difficulty, improving  Protocols used: Blood Pressure - High-A-AH, Neurologic Deficit-A-AH

## 2023-06-08 ENCOUNTER — Encounter: Payer: Self-pay | Admitting: Internal Medicine

## 2023-06-08 ENCOUNTER — Telehealth: Payer: Self-pay

## 2023-06-08 ENCOUNTER — Ambulatory Visit: Admitting: Internal Medicine

## 2023-06-08 VITALS — BP 124/100 | HR 102 | Temp 98.7°F | Ht 66.0 in | Wt 187.0 lb

## 2023-06-08 DIAGNOSIS — F419 Anxiety disorder, unspecified: Secondary | ICD-10-CM | POA: Diagnosis not present

## 2023-06-08 DIAGNOSIS — I6783 Posterior reversible encephalopathy syndrome: Secondary | ICD-10-CM

## 2023-06-08 DIAGNOSIS — M25511 Pain in right shoulder: Secondary | ICD-10-CM

## 2023-06-08 MED ORDER — HYDROCODONE-ACETAMINOPHEN 5-325 MG PO TABS: 1.0000 | ORAL_TABLET | ORAL | 0 refills | Status: DC | PRN

## 2023-06-08 NOTE — Telephone Encounter (Signed)
 Called and lvm for pt on behalf of patient and related message if bp was less than 160/105 she can do PT per her providers request

## 2023-06-08 NOTE — Telephone Encounter (Signed)
 This is in regards to the message patient and sent regarding her arm pain. Wanting to increase medication

## 2023-06-08 NOTE — Progress Notes (Unsigned)
   Subjective:   Patient ID: Dominique Stevens, female    DOB: [birth year], 48 y.o.   MRN: [id number]  HPI The patient is a 48 YO female coming in for follow up multiple conditions. BP is running {}. She is still having mobility issues and pain in her arms. Sleep is {}  Review of Systems  Objective:  Physical Exam  There were no vitals filed for this visit.  Assessment & Plan:

## 2023-06-08 NOTE — Patient Instructions (Signed)
 We have sent in the pain medicine and will call PT.

## 2023-06-09 DIAGNOSIS — M25511 Pain in right shoulder: Secondary | ICD-10-CM | POA: Insufficient documentation

## 2023-06-09 NOTE — Assessment & Plan Note (Signed)
 Overall she is still struggling with this. Not being able to work with PT has worsened this. She is taking alprazolam  0.5 mg TID prn and paxil  20 mg daily. Will adjust dose of paxil  at follow up if still struggling. She has seen some benefit with this.

## 2023-06-09 NOTE — Assessment & Plan Note (Signed)
 Likely due to overuse with recent neurological symptoms and being unable to use both sides of body equally. Use hydrocodone  short term to help with pain so she can work with PT. She was improving on pain with working with PT.

## 2023-06-09 NOTE — Assessment & Plan Note (Signed)
 BP is trending towards normal. Will keep losartan  50 mg daily and propranolol  60 mg daily due to most readings at goal at home. Will call PT and have them work with her if BP <160/105 as this was helping her improve to normal.

## 2023-06-10 ENCOUNTER — Encounter: Payer: Self-pay | Admitting: Internal Medicine

## 2023-06-11 NOTE — Telephone Encounter (Signed)
 Called Patient physical therapy again and LVM for the second time and left a very detailed message

## 2023-06-15 ENCOUNTER — Encounter: Payer: Self-pay | Admitting: Internal Medicine

## 2023-06-15 ENCOUNTER — Other Ambulatory Visit: Payer: Self-pay | Admitting: Internal Medicine

## 2023-06-15 MED ORDER — HYDROCODONE-ACETAMINOPHEN 5-325 MG PO TABS: 1.0000 | ORAL_TABLET | ORAL | 0 refills | Status: AC | PRN

## 2023-06-15 NOTE — Telephone Encounter (Unsigned)
 Copied from CRM [id number]. Topic: Clinical - Medication Refill >> Jun 15, 2023 11:24 AM Juleen Oakland F wrote: Medication: HYDROcodone -acetaminophen  (NORCO/VICODIN) 5-325 MG tablet   Has the patient contacted their pharmacy? Yes (Agent: If no, request that the patient contact the pharmacy for the refill. If patient does not wish to contact the pharmacy document the reason why and proceed with request.) (Agent: If yes, when and what did the pharmacy advise?)  This is the patient's preferred pharmacy:  [id number] - [place], [place] - [address] AT [facility] [address] [place] [address] Phone: [phone] Fax: [phone]  Is this the correct pharmacy for this prescription? Yes If no, delete pharmacy and type the correct one.   Has the prescription been filled recently? Yes  Is the patient out of the medication? No  Has the patient been seen for an appointment in the last year OR does the patient have an upcoming appointment? Yes  Can we respond through MyChart? Yes  Agent: Please be advised that Rx refills may take up to 3 business days. We ask that you follow-up with your pharmacy.

## 2023-06-15 NOTE — Telephone Encounter (Signed)
 Prescription sent it on 06/08/23.

## 2023-06-22 ENCOUNTER — Encounter: Payer: Self-pay | Admitting: Internal Medicine

## 2023-06-22 ENCOUNTER — Telehealth: Payer: Self-pay | Admitting: Internal Medicine

## 2023-06-22 NOTE — Telephone Encounter (Signed)
 Copied from CRM [id number]. Topic: Clinical - Medication Refill >> Jun 22, 2023  8:55 AM Jenice Mitts wrote: Medication: HYDROcodone -acetaminophen  (NORCO/VICODIN) 5-325 MG tablet   Has the patient contacted their pharmacy? Yes (Agent: If no, request that the patient contact the pharmacy for the refill. If patient does not wish to contact the pharmacy document the reason why and proceed with request.) (Agent: If yes, when and what did the pharmacy advise?)  This is the patient's preferred pharmacy:  [id number] - [place], [place] - [address] AT [facility] [address] [place] [address] Phone: [phone] Fax: [phone]  Is this the correct pharmacy for this prescription? No If no, delete pharmacy and type the correct one.   Has the prescription been filled recently? Yes  Is the patient out of the medication? Yes  Has the patient been seen for an appointment in the last year OR does the patient have an upcoming appointment? Yes  Can we respond through MyChart? Yes  Agent: Please be advised that Rx refills may take up to 3 business days. We ask that you follow-up with your pharmacy.

## 2023-06-23 ENCOUNTER — Telehealth: Payer: Self-pay

## 2023-06-23 ENCOUNTER — Other Ambulatory Visit: Payer: Self-pay | Admitting: Family

## 2023-06-23 MED ORDER — HYDROCODONE-ACETAMINOPHEN 5-325 MG PO TABS: 1.0000 | ORAL_TABLET | Freq: Four times a day (QID) | ORAL | 0 refills | Status: DC | PRN

## 2023-06-23 NOTE — Telephone Encounter (Signed)
 Copied from CRM [id number]. Topic: Clinical - Medication Refill  >> Jun 23, 2023  9:06 AM Allyne Areola wrote: Patient is calling to follow on this refill request. Advised patient of the 3 day turn around time.

## 2023-06-23 NOTE — Telephone Encounter (Signed)
 Spoke with front desk reception at [facility] and she informed me that the patient did indeed come in and have her Pt and Speech evaluation appointment and never scheduled up for her follow up appointment for either speech and Pt. They did  give patient her instruction and told her how many times she would need to get back. Patient just need to call and schedule her next appointment.

## 2023-06-23 NOTE — Telephone Encounter (Signed)
 I called [facility] and spoke with the receptionist and she was able to inform her that patient did come in and do her evaluation for PT and  Speech she just never scheduled her follow up appointment

## 2023-06-23 NOTE — Telephone Encounter (Signed)
 Covering provider has sent in for patient Dominique Stevens

## 2023-06-23 NOTE — Telephone Encounter (Signed)
 This has been forwarded to provider who is covering Dr Nicolette Barrio have explained to patient that this is not guaranteed

## 2023-06-28 ENCOUNTER — Other Ambulatory Visit: Payer: Self-pay | Admitting: Internal Medicine

## 2023-06-28 NOTE — Telephone Encounter (Unsigned)
 Copied from CRM [id number]. Topic: Clinical - Medication Refill >> Jun 28, 2023  2:45 PM Winona R wrote: Medication: temazepam  (RESTORIL ) 15 MG capsule  Has the patient contacted their pharmacy? Yes, there were no refills avail   This is the patient's preferred pharmacy:  [id number] - [place], [place] - [address] AT [facility] [address] [place] [address] Phone: [phone] Fax: [phone]  Is this the correct pharmacy for this prescription? Yes If no, delete pharmacy and type the correct one.   Has the prescription been filled recently? No  Is the patient out of the medication? yes  Has the patient been seen for an appointment in the last year OR does the patient have an upcoming appointment? Yes  Can we respond through MyChart? Yes  Agent: Please be advised that Rx refills may take up to 3 business days. We ask that you follow-up with your pharmacy.

## 2023-06-28 NOTE — Telephone Encounter (Signed)
 Addressed.

## 2023-06-29 ENCOUNTER — Encounter: Payer: Self-pay | Admitting: Internal Medicine

## 2023-06-29 ENCOUNTER — Telehealth: Payer: Self-pay | Admitting: Internal Medicine

## 2023-06-29 NOTE — Telephone Encounter (Unsigned)
 Copied from CRM [id number]. Topic: Clinical - Medication Question >> Jun 29, 2023  8:44 AM Ernestene P wrote: Reason for CRM: Pt wanted more clarification on why temazepam  (RESTORIL ) 15 MG capsule was denied after advising refill not appropriate, pt would like to be reached [phone]

## 2023-06-30 ENCOUNTER — Encounter: Payer: Self-pay | Admitting: Internal Medicine

## 2023-06-30 NOTE — Telephone Encounter (Signed)
 I do not see where this was denied. Ok to refill for patient

## 2023-07-01 ENCOUNTER — Encounter: Payer: Self-pay | Admitting: Internal Medicine

## 2023-07-02 ENCOUNTER — Telehealth: Admitting: Internal Medicine

## 2023-07-02 ENCOUNTER — Encounter: Payer: Self-pay | Admitting: Internal Medicine

## 2023-07-02 VITALS — BP 118/73

## 2023-07-02 DIAGNOSIS — M25511 Pain in right shoulder: Secondary | ICD-10-CM | POA: Diagnosis not present

## 2023-07-02 DIAGNOSIS — F419 Anxiety disorder, unspecified: Secondary | ICD-10-CM

## 2023-07-02 MED ORDER — HYDROCODONE-ACETAMINOPHEN 5-325 MG PO TABS: 1.0000 | ORAL_TABLET | Freq: Four times a day (QID) | ORAL | 0 refills | Status: DC | PRN

## 2023-07-02 MED ORDER — TRAZODONE HCL 50 MG PO TABS: 50.0000 mg | ORAL_TABLET | Freq: Every day | ORAL | 0 refills | Status: DC

## 2023-07-02 NOTE — Telephone Encounter (Signed)
 Copied from CRM [id number]. Topic: Clinical - Prescription Issue >> Jul 02, 2023 11:44 AM Maisie C wrote: Reason for CRM: pt called back to follow-up with Micaiah regarding her rx issue for hydrocodone . I informed her that her message was received and she should expect a response once pcp is able to address the issue.

## 2023-07-02 NOTE — Assessment & Plan Note (Signed)
 Rx trazodone 50 mg at bedtime for sleep and continue paxil  20 mg daily.

## 2023-07-02 NOTE — Telephone Encounter (Signed)
 Pt had a virtual with provider today closing this encounter

## 2023-07-02 NOTE — Progress Notes (Signed)
 Virtual Visit via Video Note  I connected with Dominique Stevens on 07/02/23 at  8:20 AM EDT by a video enabled telemedicine application and verified that I am speaking with the correct person using two identifiers.  The patient and the provider were at separate locations throughout the entire encounter. Patient location: home, Provider location: work   I discussed the limitations of evaluation and management by telemedicine and the availability of in person appointments. The patient expressed understanding and agreed to proceed. The patient and the provider were the only parties present for the visit unless noted in HPI below.  History of Present Illness: The patient is a 48 y.o. female with visit for sleeping troubles. Needs alternative for temazepam . Also needs refill for hydrocodone  and is doing some better with ROM.  Observations/Objective: Appearance: normal, breathing appears normal no coughing, casual grooming, mental status is A and O times 3  Assessment and Plan: See problem oriented charting  Follow Up Instructions: refill hydrocodone  and rx trazodone for sleep  I discussed the assessment and treatment plan with the patient. The patient was provided an opportunity to ask questions and all were answered. The patient agreed with the plan and demonstrated an understanding of the instructions.   The patient was advised to call back or seek an in-person evaluation if the symptoms worsen or if the condition fails to improve as anticipated.  Dominique DELENA [place], MD

## 2023-07-02 NOTE — Assessment & Plan Note (Signed)
 Improving some and still using hydrocodone  for pain refilled up to BID prn #60.

## 2023-07-05 NOTE — Telephone Encounter (Signed)
Closing this encounter ERROR

## 2023-07-06 ENCOUNTER — Ambulatory Visit: Admitting: Internal Medicine

## 2023-07-20 ENCOUNTER — Encounter: Payer: Self-pay | Admitting: Internal Medicine

## 2023-07-20 ENCOUNTER — Encounter (HOSPITAL_COMMUNITY): Payer: Self-pay

## 2023-07-20 ENCOUNTER — Emergency Department (HOSPITAL_COMMUNITY)

## 2023-07-20 ENCOUNTER — Other Ambulatory Visit: Payer: Self-pay

## 2023-07-20 ENCOUNTER — Emergency Department (HOSPITAL_COMMUNITY)
Admission: EM | Admit: 2023-07-20 | Discharge: 2023-07-20 | Disposition: A | Discharge status: Home / Self Care | Attending: Emergency Medicine | Admitting: Emergency Medicine

## 2023-07-20 DIAGNOSIS — D72829 Elevated white blood cell count, unspecified: Secondary | ICD-10-CM | POA: Insufficient documentation

## 2023-07-20 DIAGNOSIS — E876 Hypokalemia: Secondary | ICD-10-CM | POA: Insufficient documentation

## 2023-07-20 DIAGNOSIS — S0990XA Unspecified injury of head, initial encounter: Secondary | ICD-10-CM | POA: Diagnosis present

## 2023-07-20 DIAGNOSIS — R1013 Epigastric pain: Secondary | ICD-10-CM | POA: Diagnosis not present

## 2023-07-20 LAB — COMPREHENSIVE METABOLIC PANEL WITH GFR
ALT: 17 U/L (ref 0–44)
AST: 26 U/L (ref 15–41)
Albumin: 3.6 g/dL (ref 3.5–5.0)
Alkaline Phosphatase: 87 U/L (ref 38–126)
Anion gap: 10 (ref 5–15)
BUN: 7 mg/dL (ref 6–20)
CO2: 21 mmol/L — ABNORMAL LOW (ref 22–32)
Calcium: 8.8 mg/dL — ABNORMAL LOW (ref 8.9–10.3)
Chloride: 108 mmol/L (ref 98–111)
Creatinine, Ser: 0.65 mg/dL (ref 0.44–1.00)
GFR, Estimated: >60 mL/min (ref >60)
Glucose, Bld: 94 mg/dL (ref 70–99)
Potassium: 3.4 mmol/L — ABNORMAL LOW (ref 3.5–5.1)
Sodium: 139 mmol/L (ref 135–145)
Total Bilirubin: 0.4 mg/dL (ref 0.0–1.2)
Total Protein: 6.4 g/dL — ABNORMAL LOW (ref 6.5–8.1)

## 2023-07-20 LAB — CBC WITH DIFFERENTIAL/PLATELET
Abs Immature Granulocytes: 0.03 K/uL (ref 0.00–0.07)
Basophils Absolute: 0.1 K/uL (ref 0.0–0.1)
Basophils Relative: 1 %
Eosinophils Absolute: 0.1 K/uL (ref 0.0–0.5)
Eosinophils Relative: 1 %
HCT: 39.7 % (ref 36.0–46.0)
Hemoglobin: 13.1 g/dL (ref 12.0–15.0)
Immature Granulocytes: 0 %
Lymphocytes Relative: 34 %
Lymphs Abs: 3.6 K/uL (ref 0.7–4.0)
MCH: 32.2 pg (ref 26.0–34.0)
MCHC: 33 g/dL (ref 30.0–36.0)
MCV: 97.5 fL (ref 80.0–100.0)
Monocytes Absolute: 0.7 K/uL (ref 0.1–1.0)
Monocytes Relative: 7 %
Neutro Abs: 6.1 K/uL (ref 1.7–7.7)
Neutrophils Relative %: 57 %
Platelets: 300 K/uL (ref 150–400)
RBC: 4.07 MIL/uL (ref 3.87–5.11)
RDW: 11.8 % (ref 11.5–15.5)
WBC: 10.6 K/uL — ABNORMAL HIGH (ref 4.0–10.5)
nRBC: 0 % (ref 0.0–0.2)

## 2023-07-20 LAB — I-STAT CHEM 8, ED
BUN: 8 mg/dL (ref 6–20)
Calcium, Ion: 1.05 mmol/L — ABNORMAL LOW (ref 1.15–1.40)
Chloride: 109 mmol/L (ref 98–111)
Creatinine, Ser: 0.6 mg/dL (ref 0.44–1.00)
Glucose, Bld: 92 mg/dL (ref 70–99)
HCT: 39 % (ref 36.0–46.0)
Hemoglobin: 13.3 g/dL (ref 12.0–15.0)
Potassium: 3.5 mmol/L (ref 3.5–5.1)
Sodium: 141 mmol/L (ref 135–145)
TCO2: 22 mmol/L (ref 22–32)

## 2023-07-20 LAB — I-STAT CG4 LACTIC ACID, ED: Lactic Acid, Venous: 1.2 mmol/L (ref 0.5–1.9)

## 2023-07-20 MED ORDER — MORPHINE SULFATE (PF) 4 MG/ML IV SOLN
4.0000 mg | Freq: Once | INTRAVENOUS | Status: AC
  Administered 2023-07-20: 4 mg via INTRAVENOUS
  Filled 2023-07-20: qty 1

## 2023-07-20 MED ORDER — IOHEXOL 350 MG/ML SOLN
75.0000 mL | Freq: Once | INTRAVENOUS | Status: AC | PRN
  Administered 2023-07-20: 75 mL via INTRAVENOUS

## 2023-07-20 MED ORDER — ONDANSETRON 4 MG PO TBDP
4.0000 mg | ORAL_TABLET | Freq: Once | ORAL | Status: AC
  Administered 2023-07-20: 4 mg via ORAL
  Filled 2023-07-20: qty 1

## 2023-07-20 MED ORDER — HYDROCODONE-ACETAMINOPHEN 5-325 MG PO TABS
2.0000 | ORAL_TABLET | Freq: Once | ORAL | Status: AC
  Administered 2023-07-20: 2 via ORAL
  Filled 2023-07-20: qty 2

## 2023-07-20 MED ORDER — KETOROLAC TROMETHAMINE 15 MG/ML IJ SOLN
15.0000 mg | Freq: Once | INTRAMUSCULAR | Status: DC
  Filled 2023-07-20: qty 1

## 2023-07-20 MED ORDER — ONDANSETRON HCL 4 MG/2ML IJ SOLN
4.0000 mg | Freq: Once | INTRAMUSCULAR | Status: AC
  Administered 2023-07-20: 4 mg via INTRAVENOUS
  Filled 2023-07-20: qty 2
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
## 2023-07-20 NOTE — ED Notes (Signed)
 Pt continues to sit up and roll around in the bed; no signs of distress noted. Pt heard on the  phone  stating  these people are stupid. I'm leaving this place and going home.
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
## 2023-07-20 NOTE — ED Triage Notes (Signed)
 Pt now reporting (+) LOC lasting about 1 minute.
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
## 2023-07-20 NOTE — ED Notes (Signed)
 Pt yelling at this RN  you should learn to be more companionate Porter Moes. You could of given me actual pain meds not that pretend shit. You need to do your job better. Pt educated on the recommended pain control after the discharge. Pt refused to let staff get her vital signs. Hunter, RN assisted pt with getting to the lobby.

## 2023-07-20 NOTE — Progress Notes (Signed)
 Orthopedic Tech Progress Note Patient Details:  Dominique Stevens [birth year] [id number]  Patient ID: [name], female   DOB: November 25, 1975, 48 y.o.   MRN: [id number] I attended trauma page. Chandra Dorn PARAS 07/20/2023, 2:54 AM

## 2023-07-20 NOTE — ED Notes (Signed)
 This RN attempted to give the pt Toradol ; pt yelled at this RN and stated you are a fucking joke. You do not care about me. I'm leaving. Pt sits up in the bed, rips off all monitors and attempts to get up. Pt was not tearful and no signs of distress noted. EDP aware.

## 2023-07-20 NOTE — Progress Notes (Signed)
   07/20/23 0350  Spiritual Encounters  Type of Visit Follow up  Care provided to: Patient  Conversation partners present during encounter Nurse  OnCall Visit Yes   On follow up found patient in distress. Previously I checked with nurse about medication, nurse stated she was business and had several other patient. (In an unkind manner) Expained patient has neck, back and head pain and no medication since arrival.  Upon return this chaplain addressed the samwe nurse who had not yet given patient any medis. Approached nurse again, who seemed bothered by my inquire. Nurse checked with another nurse and decided that they could give patient meds. However after 30 minutes the patient was still in pain and it was determined that the IV was not working. Nurse appeared upset that patient continued to complain of pain. Patient spoke very soft and calmly.   Patient has neck brace and back pain, however nurse decided to sit patient up (before receiving the results of the CT. This further inflicted pain in the patient. Chaplain plead with nurse to rectify the situation, finally nurse adjusted bed for patient.   Patient arrived after having a traumatic experience which is being intensified by hospital staff. This chaplain observed the nurse laughing and intentually having a slow response.
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
## 2023-07-21 ENCOUNTER — Encounter: Payer: Self-pay | Admitting: Internal Medicine

## 2023-07-21 MED ORDER — HYDROCODONE-ACETAMINOPHEN 5-325 MG PO TABS: 1.0000 | ORAL_TABLET | Freq: Four times a day (QID) | ORAL | 0 refills | Status: DC | PRN

## 2023-07-21 NOTE — Addendum Note (Signed)
 Addended by: ROLLENE NORRIS A on: 07/21/2023 02:30 PM   Modules accepted: Orders

## 2023-07-26 ENCOUNTER — Encounter: Payer: Self-pay | Admitting: Internal Medicine

## 2023-07-28 ENCOUNTER — Encounter: Payer: Self-pay | Admitting: Internal Medicine

## 2023-07-28 NOTE — Telephone Encounter (Signed)
 I have scheduled patient a appointment wit matthews for tomorrow to address the sickness

## 2023-07-29 ENCOUNTER — Encounter: Payer: Self-pay | Admitting: Family Medicine

## 2023-07-29 ENCOUNTER — Telehealth: Admitting: Family Medicine

## 2023-07-29 DIAGNOSIS — J069 Acute upper respiratory infection, unspecified: Secondary | ICD-10-CM | POA: Diagnosis not present

## 2023-07-29 DIAGNOSIS — F5101 Primary insomnia: Secondary | ICD-10-CM | POA: Diagnosis not present

## 2023-07-29 DIAGNOSIS — F410 Panic disorder [episodic paroxysmal anxiety] without agoraphobia: Secondary | ICD-10-CM

## 2023-07-29 DIAGNOSIS — F430 Acute stress reaction: Secondary | ICD-10-CM

## 2023-07-29 DIAGNOSIS — I10 Essential (primary) hypertension: Secondary | ICD-10-CM | POA: Diagnosis not present

## 2023-07-29 MED ORDER — PROPRANOLOL HCL ER 60 MG PO CP24
120.0000 mg | ORAL_CAPSULE | Freq: Every day | ORAL | 0 refills | Status: DC
Start: 2023-07-29 — End: 2023-08-20

## 2023-07-29 MED ORDER — ALPRAZOLAM 1 MG PO TABS: 1.0000 mg | ORAL_TABLET | Freq: Two times a day (BID) | ORAL | 0 refills | Status: DC | PRN

## 2023-07-29 NOTE — Assessment & Plan Note (Signed)
 Supportive care at home, will consider antibiotic if no improvement by Monday

## 2023-07-29 NOTE — Assessment & Plan Note (Signed)
 Has been taking 100mg  trazodone , may INCREASE to 150mg 

## 2023-07-29 NOTE — Assessment & Plan Note (Signed)
 Looking for counselor Continue Paxil 

## 2023-07-29 NOTE — Assessment & Plan Note (Signed)
 INCREASE propranolol  to 120mg  once daily

## 2023-07-29 NOTE — Patient Instructions (Signed)
 INCREASE alprazolam  1mg  twice a day for panic attacks.  INCREASE Propranolol  to 2 capsules once daily to help with physical symptoms of panic attacks and help with BP  INCREASE trazodone  to up to 150mg  nightly  Follow up with us  in a month to recheck.

## 2023-07-29 NOTE — Progress Notes (Signed)
 Virtual Visit via Video Note  I connected with Dominique Stevens on 07/29/23 at  9:20 AM EDT by a video enabled telemedicine application and verified that I am speaking with the correct person using two identifiers.  Patient Location: Home Provider Location: Office/Clinic  I discussed the limitations, risks, security, and privacy concerns of performing an evaluation and management service by video and the availability of in person appointments. I also discussed with the patient that there may be a patient responsible charge related to this service. The patient expressed understanding and agreed to proceed.  Subjective: PCP: Rollene Almarie LABOR, MD  Chief Complaint  Patient presents with   Fatigue    Lost voice 2 days ago, coughing and sore throat started yesterday.   HPI  48 yo F presents for AV visit today for increased blood pressure, increased incidence of panic attacks. Denies SI, HI. States that she has not been sleeping, only got 2 hrs last night.  Tremendously increased stress at home.  BPs have been 134/130 Denies HA, vision changes, acute numbness, tingling, weakness.  Reports URI symptoms for the last 3 days.  Not treating at home currently.  Negative COVID test yesterday.    ROS: Per HPI  Current Outpatient Medications:    ALPRAZolam  (XANAX ) 1 MG tablet, Take 1 tablet (1 mg total) by mouth 2 (two) times daily as needed for anxiety., Disp: 20 tablet, Rfl: 0   esomeprazole  (NEXIUM ) 40 MG capsule, TAKE 1 CAPSULE (40 MG TOTAL) BY MOUTH DAILY AT 12 NOON., Disp: 30 capsule, Rfl: 11   Estradiol -Norethindrone  Acet 0.5-0.1 MG tablet, TAKE 1 TABLET BY MOUTH EVERY DAY, Disp: 28 tablet, Rfl: 12   HYDROcodone -acetaminophen  (NORCO/VICODIN) 5-325 MG tablet, Take 1 tablet by mouth every 6 (six) hours as needed for moderate pain (pain score 4-6). Dx M25.511, ok to dispense with concurrent alprazolam , Disp: 60 tablet, Rfl: 0   Loratadine (CLARITIN PO), Take by mouth., Disp: , Rfl:     losartan  (COZAAR ) 50 MG tablet, Take 1 tablet (50 mg total) by mouth daily., Disp: 90 tablet, Rfl: 0   magnesium  oxide (MAG-OX) 400 (240 Mg) MG tablet, Take 1 tablet (400 mg total) by mouth daily., Disp: 90 tablet, Rfl: 3   nystatin  cream (MYCOSTATIN ), APPLY TO AFFECTED AREA TWICE A DAY, Disp: 90 g, Rfl: 0   PARoxetine  (PAXIL ) 20 MG tablet, Take 1 tablet (20 mg total) by mouth daily., Disp: 90 tablet, Rfl: 1   propranolol  ER (INDERAL  LA) 60 MG 24 hr capsule, Take 2 capsules (120 mg total) by mouth daily., Disp: 60 capsule, Rfl: 0   traZODone  (DESYREL ) 50 MG tablet, Take 1 tablet (50 mg total) by mouth at bedtime., Disp: 90 tablet, Rfl: 0  Observations/Objective: There were no vitals filed for this visit. Physical Exam Vitals and nursing note reviewed.  Constitutional:      General: She is not in acute distress.    Comments: Appears fatigued  HENT:     Head: Normocephalic and atraumatic.     Mouth/Throat:     Comments: Hoarse voice Eyes:     Extraocular Movements: Extraocular movements intact.  Pulmonary:     Effort: Pulmonary effort is normal.     Comments: Moderate cough present Musculoskeletal:     Cervical back: Normal range of motion.  Neurological:     General: No focal deficit present.     Mental Status: She is alert and oriented to person, place, and time.  Psychiatric:  Mood and Affect: Mood normal.        Behavior: Behavior normal.     Assessment and Plan: Panic disorder Assessment & Plan: INCREASE alprazolam  1mg  BID prn  Orders: -     Propranolol  HCl ER; Take 2 capsules (120 mg total) by mouth daily.  Dispense: 60 capsule; Refill: 0 -     ALPRAZolam ; Take 1 tablet (1 mg total) by mouth 2 (two) times daily as needed for anxiety.  Dispense: 20 tablet; Refill: 0  Primary hypertension Assessment & Plan: INCREASE propranolol  to 120mg  once daily  Orders: -     Propranolol  HCl ER; Take 2 capsules (120 mg total) by mouth daily.  Dispense: 60 capsule; Refill:  0  Viral URI with cough Assessment & Plan: Supportive care at home, will consider antibiotic if no improvement by Monday   Primary insomnia Assessment & Plan: Has been taking 100mg  trazodone , may INCREASE to 150mg    Stress reaction Assessment & Plan: Looking for counselor Continue Paxil      Follow Up Instructions: Return in about 4 weeks (around 08/26/2023) for follow up for meds, HTN, anxiety.   I discussed the assessment and treatment plan with the patient. The patient was provided an opportunity to ask questions, and all were answered. The patient agreed with the plan and demonstrated an understanding of the instructions.   The patient was advised to call back or seek an in-person evaluation if the symptoms worsen or if the condition fails to improve as anticipated.  The above assessment and management plan was discussed with the patient. The patient verbalized understanding of and has agreed to the management plan.   I spent 25 minutes on this patient encounter including counseling, diagnosis, treatment options, documentation, face-to-face time.   Corean LITTIE Ku, FNP

## 2023-07-29 NOTE — Assessment & Plan Note (Signed)
 INCREASE alprazolam  1mg  BID prn

## 2023-08-02 ENCOUNTER — Encounter: Payer: Self-pay | Admitting: Internal Medicine

## 2023-08-02 ENCOUNTER — Telehealth: Admitting: Internal Medicine

## 2023-08-02 DIAGNOSIS — M25511 Pain in right shoulder: Secondary | ICD-10-CM

## 2023-08-02 DIAGNOSIS — I10 Essential (primary) hypertension: Secondary | ICD-10-CM | POA: Diagnosis not present

## 2023-08-02 DIAGNOSIS — F410 Panic disorder [episodic paroxysmal anxiety] without agoraphobia: Secondary | ICD-10-CM | POA: Diagnosis not present

## 2023-08-02 DIAGNOSIS — R051 Acute cough: Secondary | ICD-10-CM

## 2023-08-02 MED ORDER — HYDROCOD POLI-CHLORPHE POLI ER 10-8 MG/5ML PO SUER: 5.0000 mL | Freq: Two times a day (BID) | ORAL | 0 refills | Status: DC | PRN

## 2023-08-02 MED ORDER — ALPRAZOLAM 1 MG PO TABS: 1.0000 mg | ORAL_TABLET | Freq: Three times a day (TID) | ORAL | 0 refills | Status: DC | PRN

## 2023-08-02 MED ORDER — FLUCONAZOLE 150 MG PO TABS: 150.0000 mg | ORAL_TABLET | Freq: Once | ORAL | 0 refills | Status: DC

## 2023-08-02 MED ORDER — DOXYCYCLINE HYCLATE 100 MG PO TABS: 100.0000 mg | ORAL_TABLET | Freq: Two times a day (BID) | ORAL | 0 refills | Status: DC

## 2023-08-02 NOTE — Assessment & Plan Note (Signed)
 Suspect bronchitis as going on >1 week without improvement. Rx tussionex and doxycycline  to help. Rx diflucan  in case of yeast infection
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
## 2023-08-02 NOTE — Assessment & Plan Note (Signed)
 This has improved with massage and she is no longer using hydrocodone  for pain.

## 2023-08-03 MED ORDER — HYDROCOD POLI-CHLORPHE POLI ER 10-8 MG/5ML PO SUER: 5.0000 mL | Freq: Two times a day (BID) | ORAL | 0 refills | Status: DC | PRN

## 2023-08-03 NOTE — Telephone Encounter (Signed)
 Copied from CRM [id number]. Topic: Clinical - Prescription Issue >> Aug 02, 2023  4:35 PM Chiquita SQUIBB wrote: Reason for CRM: Patient is calling in due to the pharmacy not having the chlorpheniramine-HYDROcodone  (TUSSIONEX) 10-8 MG/5ML [[id number]]. Patient is asking if this can be re sent to the [facility] in her chart, as they have it in stock.

## 2023-08-03 NOTE — Telephone Encounter (Signed)
 Please resend medicine

## 2023-08-05 NOTE — Telephone Encounter (Signed)
 Fyi.

## 2023-08-08 ENCOUNTER — Encounter: Payer: Self-pay | Admitting: Internal Medicine

## 2023-08-09 ENCOUNTER — Telehealth: Payer: Self-pay

## 2023-08-09 ENCOUNTER — Encounter: Payer: Self-pay | Admitting: Internal Medicine

## 2023-08-09 ENCOUNTER — Telehealth: Payer: Self-pay | Admitting: Internal Medicine

## 2023-08-09 MED ORDER — CYCLOBENZAPRINE HCL 5 MG PO TABS: 5.0000 mg | ORAL_TABLET | Freq: Three times a day (TID) | ORAL | 1 refills | Status: DC | PRN

## 2023-08-09 NOTE — Telephone Encounter (Unsigned)
 Copied from CRM [id number]. Topic: Clinical - Medication Refill >> Aug 09, 2023  9:30 AM Carlatta H wrote: Medication: chlorpheniramine-HYDROcodone  (TUSSIONEX) 10-8 MG/5ML  Has the patient contacted their pharmacy? No (Agent: If no, request that the patient contact the pharmacy for the refill. If patient does not wish to contact the pharmacy document the reason why and proceed with request.) (Agent: If yes, when and what did the pharmacy advise?)  This is the patient's preferred pharmacy:  [id number] - [place],  - [address] AT [facility] [address] [place] [address] Phone: [phone] Fax: [phone]    Is this the correct pharmacy for this prescription? Yes If no, delete pharmacy and type the correct one.   Has the prescription been filled recently? No  Is the patient out of the medication? Yes  Has the patient been seen for an appointment in the last year OR does the patient have an upcoming appointment? No  Can we respond through MyChart? No  Agent: Please be advised that Rx refills may take up to 3 business days. We ask that you follow-up with your pharmacy.

## 2023-08-09 NOTE — Telephone Encounter (Signed)
 This is a cough medicine, the mychart is about pain medicine. Is she wanting the cough medicine as well?

## 2023-08-09 NOTE — Telephone Encounter (Signed)
 Patient has already mentioned in regards to this via mychart but patient is requesting a refill

## 2023-08-10 ENCOUNTER — Ambulatory Visit: Admitting: Internal Medicine

## 2023-08-10 ENCOUNTER — Encounter: Payer: Self-pay | Admitting: Internal Medicine

## 2023-08-10 NOTE — Telephone Encounter (Signed)
 Called and spoke with patient already in regards to this. See separate encounters

## 2023-08-10 NOTE — Telephone Encounter (Signed)
 Copied from CRM [id number]. Topic: General - Other >> Aug 10, 2023  3:28 PM Franky GRADE wrote: Reason for CRM: Patient is calling to speak with Rosalva Coder. Patient states Coder is aware of the call.

## 2023-08-11 ENCOUNTER — Other Ambulatory Visit: Payer: Self-pay

## 2023-08-11 ENCOUNTER — Encounter: Payer: Self-pay | Admitting: Psychology

## 2023-08-11 ENCOUNTER — Ambulatory Visit: Admitting: Internal Medicine

## 2023-08-11 ENCOUNTER — Ambulatory Visit (INDEPENDENT_AMBULATORY_CARE_PROVIDER_SITE_OTHER): Admitting: Psychology

## 2023-08-11 ENCOUNTER — Encounter: Payer: Self-pay | Admitting: Internal Medicine

## 2023-08-11 DIAGNOSIS — F410 Panic disorder [episodic paroxysmal anxiety] without agoraphobia: Secondary | ICD-10-CM

## 2023-08-11 DIAGNOSIS — F331 Major depressive disorder, recurrent, moderate: Secondary | ICD-10-CM

## 2023-08-11 MED ORDER — HYDROCODONE-ACETAMINOPHEN 5-325 MG PO TABS: 1.0000 | ORAL_TABLET | Freq: Four times a day (QID) | ORAL | 0 refills | Status: DC | PRN

## 2023-08-11 NOTE — Telephone Encounter (Signed)
 Closing this encounter as it has been addressed by the provider through my chart

## 2023-08-11 NOTE — Telephone Encounter (Signed)
 Patient has called the office. Two separate encounters in regards to the same concern. I have already informed her provider of the messages and ask her to bare with us  as when her provider has time she will address her concern as she is in clinicals

## 2023-08-11 NOTE — Progress Notes (Signed)
   Dominique Stevens, [facility]
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
## 2023-08-13 ENCOUNTER — Ambulatory Visit: Admitting: Internal Medicine

## 2023-08-18 ENCOUNTER — Telehealth (INDEPENDENT_AMBULATORY_CARE_PROVIDER_SITE_OTHER): Admitting: Internal Medicine

## 2023-08-18 VITALS — BP 134/101 | Ht 66.0 in | Wt 172.0 lb

## 2023-08-18 DIAGNOSIS — M25511 Pain in right shoulder: Secondary | ICD-10-CM

## 2023-08-18 DIAGNOSIS — F410 Panic disorder [episodic paroxysmal anxiety] without agoraphobia: Secondary | ICD-10-CM

## 2023-08-18 DIAGNOSIS — F418 Other specified anxiety disorders: Secondary | ICD-10-CM | POA: Diagnosis not present

## 2023-08-18 DIAGNOSIS — F519 Sleep disorder not due to a substance or known physiological condition, unspecified: Secondary | ICD-10-CM

## 2023-08-18 MED ORDER — HYDROCODONE-ACETAMINOPHEN 10-325 MG PO TABS: 1.0000 | ORAL_TABLET | ORAL | 0 refills | Status: DC | PRN
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
## 2023-08-19 ENCOUNTER — Encounter: Payer: Self-pay | Admitting: Internal Medicine

## 2023-08-19 NOTE — Assessment & Plan Note (Signed)
 She is struggling a lot lately and counseled about timeline for feelings and how to cope with these. She is starting counseling soon which will help. She is using alprazolam  1 mg TID and paxil  20 mg daily.
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
## 2023-08-20 ENCOUNTER — Other Ambulatory Visit: Payer: Self-pay | Admitting: Family Medicine

## 2023-08-20 DIAGNOSIS — F410 Panic disorder [episodic paroxysmal anxiety] without agoraphobia: Secondary | ICD-10-CM

## 2023-08-20 DIAGNOSIS — I10 Essential (primary) hypertension: Secondary | ICD-10-CM

## 2023-08-23 ENCOUNTER — Ambulatory Visit: Admitting: Internal Medicine

## 2023-08-23 ENCOUNTER — Other Ambulatory Visit: Payer: Self-pay | Admitting: Internal Medicine

## 2023-08-23 ENCOUNTER — Encounter: Payer: Self-pay | Admitting: Internal Medicine

## 2023-08-23 MED ORDER — HYDROCODONE-ACETAMINOPHEN 10-325 MG PO TABS: 1.0000 | ORAL_TABLET | Freq: Three times a day (TID) | ORAL | 0 refills | Status: DC | PRN

## 2023-08-23 NOTE — Telephone Encounter (Signed)
 Copied from CRM #[id number]. Topic: Clinical - Medication Refill >> Aug 23, 2023  8:36 AM Pinkey ORN wrote: Medication: HYDROcodone -acetaminophen  (NORCO) 10-325 MG tablet  Has the patient contacted their pharmacy? Yes (Agent: If no, request that the patient contact the pharmacy for the refill. If patient does not wish to contact the pharmacy document the reason why and proceed with request.) (Agent: If yes, when and what did the pharmacy advise?)  This is the patient's preferred pharmacy:  [id number] - [place],  - [address] AT [facility] [address] [place] [address] Phone: [phone] Fax: [phone]  Is this the correct pharmacy for this prescription? Yes If no, delete pharmacy and type the correct one.   Has the prescription been filled recently? No  Is the patient out of the medication? Yes  Has the patient been seen for an appointment in the last year OR does the patient have an upcoming appointment? Yes  Can we respond through MyChart? Yes  Agent: Please be advised that Rx refills may take up to 3 business days. We ask that you follow-up with your pharmacy.

## 2023-08-23 NOTE — Telephone Encounter (Signed)
 08/18/23 30 tabs/0 RF

## 2023-08-23 NOTE — Telephone Encounter (Signed)
 Pt stated that she needs a refill also has sent a my chart message which I have sent over as well

## 2023-08-24 ENCOUNTER — Other Ambulatory Visit: Payer: Self-pay | Admitting: Internal Medicine

## 2023-08-24 DIAGNOSIS — I6783 Posterior reversible encephalopathy syndrome: Secondary | ICD-10-CM

## 2023-08-25 ENCOUNTER — Other Ambulatory Visit: Payer: Self-pay | Admitting: Internal Medicine

## 2023-08-27 ENCOUNTER — Encounter: Payer: Self-pay | Admitting: Internal Medicine

## 2023-08-27 DIAGNOSIS — F410 Panic disorder [episodic paroxysmal anxiety] without agoraphobia: Secondary | ICD-10-CM

## 2023-08-27 MED ORDER — ALPRAZOLAM 1 MG PO TABS: 1.0000 mg | ORAL_TABLET | Freq: Three times a day (TID) | ORAL | 1 refills | Status: DC | PRN

## 2023-08-30 ENCOUNTER — Ambulatory Visit: Admitting: Internal Medicine

## 2023-08-31 ENCOUNTER — Ambulatory Visit: Admitting: Psychology

## 2023-09-02 ENCOUNTER — Other Ambulatory Visit: Payer: Self-pay | Admitting: Internal Medicine

## 2023-09-02 ENCOUNTER — Other Ambulatory Visit: Payer: Self-pay

## 2023-09-02 ENCOUNTER — Encounter: Payer: Self-pay | Admitting: Internal Medicine

## 2023-09-02 MED ORDER — PAROXETINE HCL 20 MG PO TABS: 20.0000 mg | ORAL_TABLET | Freq: Every day | ORAL | 1 refills | Status: DC

## 2023-09-03 ENCOUNTER — Encounter: Payer: Self-pay | Admitting: Internal Medicine

## 2023-09-03 ENCOUNTER — Telehealth: Payer: Self-pay

## 2023-09-03 MED ORDER — HYDROCODONE-ACETAMINOPHEN 10-325 MG PO TABS: 1.0000 | ORAL_TABLET | Freq: Three times a day (TID) | ORAL | 0 refills | Status: DC | PRN

## 2023-09-03 NOTE — Telephone Encounter (Signed)
 Medication: Hydrocodone  Directions: Take 1 tablet by mouth 3 (three) times daily as needed  Last given: 08/23/2023 Number refills: 0 Last o/v:  Follow up:  Labs:    Can you review refill request and if necessary can you do a refill until Dr. Rollene is back in office?

## 2023-09-03 NOTE — Addendum Note (Signed)
 Addended by: NORLEEN LYNWOOD ORN on: 09/03/2023 04:04 PM   Modules accepted: Orders

## 2023-09-03 NOTE — Telephone Encounter (Signed)
 Ok done erx

## 2023-09-07 ENCOUNTER — Telehealth: Payer: Self-pay

## 2023-09-07 ENCOUNTER — Ambulatory Visit: Admitting: Psychology

## 2023-09-07 NOTE — Telephone Encounter (Signed)
 I messaged patient this morning and she does have her hydrocodone .

## 2023-09-07 NOTE — Telephone Encounter (Signed)
 Copied from CRM [id number]. Topic: Clinical - Prescription Issue >> Sep 03, 2023  4:59 PM Drema MATSU wrote: Reason for CRM: Patient stated that pharmacy still doesn't have HYDROcodone -acetaminophen  (NORCO) 10-325 MG tablet and she is needing it before the weekend.

## 2023-09-13 ENCOUNTER — Encounter: Payer: Self-pay | Admitting: Internal Medicine

## 2023-09-13 NOTE — Telephone Encounter (Signed)
 Copied from CRM [id number]. Topic: Clinical - Medical Advice >> Sep 13, 2023  3:31 PM Dominique Stevens wrote: Reason for CRM: Patient called in regarding message via mychart , patient stated she would like to know the answer before the end of the day today

## 2023-09-13 NOTE — Telephone Encounter (Signed)
 Should I inform pt to do a tele visit?

## 2023-09-14 ENCOUNTER — Telehealth (INDEPENDENT_AMBULATORY_CARE_PROVIDER_SITE_OTHER): Admitting: Family Medicine

## 2023-09-14 ENCOUNTER — Encounter: Payer: Self-pay | Admitting: Family Medicine

## 2023-09-14 ENCOUNTER — Telehealth: Payer: Self-pay | Admitting: Radiology

## 2023-09-14 DIAGNOSIS — B9689 Other specified bacterial agents as the cause of diseases classified elsewhere: Secondary | ICD-10-CM | POA: Diagnosis not present

## 2023-09-14 DIAGNOSIS — J019 Acute sinusitis, unspecified: Secondary | ICD-10-CM

## 2023-09-14 DIAGNOSIS — J208 Acute bronchitis due to other specified organisms: Secondary | ICD-10-CM | POA: Diagnosis not present

## 2023-09-14 MED ORDER — FLUCONAZOLE 150 MG PO TABS: 150.0000 mg | ORAL_TABLET | Freq: Once | ORAL | 0 refills | Status: DC

## 2023-09-14 MED ORDER — ALBUTEROL SULFATE HFA 108 (90 BASE) MCG/ACT IN AERS: 2.0000 | INHALATION_SPRAY | RESPIRATORY_TRACT | 0 refills | Status: DC | PRN

## 2023-09-14 MED ORDER — HYDROCOD POLI-CHLORPHE POLI ER 10-8 MG/5ML PO SUER: 5.0000 mL | Freq: Two times a day (BID) | ORAL | 0 refills | Status: DC | PRN

## 2023-09-14 MED ORDER — FLUCONAZOLE 150 MG PO TABS: 150.0000 mg | ORAL_TABLET | Freq: Once | ORAL | 1 refills | Status: DC

## 2023-09-14 MED ORDER — AMOXICILLIN-POT CLAVULANATE 875-125 MG PO TABS: 1.0000 | ORAL_TABLET | Freq: Two times a day (BID) | ORAL | 0 refills | Status: DC

## 2023-09-14 NOTE — Progress Notes (Addendum)
 Video Visit Note  Subjective:     Patient ID: Dominique Stevens, female    DOB: Oct 19, 1975, 48 y.o.   MRN: [id number]  Chief Complaint  Patient presents with   Acute Visit    Ongoing since Saturday. Coughing, dry cough unable to sleep at night, facial pressure, eye puffiness, head pressure. Has tried Delsym, Robitussin, Nyquil. Denies fever, N/V. Has been having sweats. Negative covid 09/08    HPI  I connected with [name] on 09/19/23 at 11:00 AM EDT by a video enabled telemedicine application and verified that I am speaking with the correct person using two identifiers.  Patient Location: Home Provider Location: Office/Clinic  I discussed the limitations, risks, security, and privacy concerns of performing an evaluation and management service by video and the availability of in person appointments. I also discussed with the patient that there may be a patient responsible charge related to this service. The patient expressed understanding and agreed to proceed.  48 year old female presents for evaluation of possible sinus infection over A/V visit. States this has been going on for about the last 7 to 10 days, that it worsened 3 days ago. Has been experiencing worsening cough, that is keeping her up at night, sinus pain and pressure, puffiness, headaches, fatigue, chills and sweats. Has taken Delsym, Robitussin, NyQuil. Denies abdominal pain, nausea, vomiting, diarrhea, rash, fever, other symptoms. Has taken home COVID test yesterday that was negative. Denies known sick contacts.  ROS Per HPI      Objective:    There were no vitals taken for this visit.   Physical Exam Vitals and nursing note reviewed.  Constitutional:      General: She is not in acute distress. HENT:     Head: Normocephalic and atraumatic.     Nose:     Comments: Sounds congested Eyes:     Extraocular Movements: Extraocular movements intact.  Pulmonary:     Effort: Pulmonary effort is normal.      Comments: Moderate cough present during A/V visit Musculoskeletal:     Cervical back: Normal range of motion.  Neurological:     General: No focal deficit present.     Mental Status: She is alert and oriented to person, place, and time.  Psychiatric:        Mood and Affect: Mood normal.        Behavior: Behavior normal.     No results found for any visits on 09/14/23.      Assessment & Plan:  Acute bacterial bronchitis -     Amoxicillin -Pot Clavulanate; Take 1 tablet by mouth 2 (two) times daily.  Dispense: 20 tablet; Refill: 0 -     Albuterol  Sulfate HFA; Inhale 2 puffs into the lungs every 4 (four) hours as needed for wheezing or shortness of breath.  Dispense: 8 g; Refill: 0 -     Fluconazole ; Take 1 tablet (150 mg total) by mouth once for 1 dose.  Dispense: 1 tablet; Refill: 1 -     Hydrocod Poli-Chlorphe Poli ER; Take 5 mLs by mouth every 12 (twelve) hours as needed for cough.  Dispense: 120 mL; Refill: 0  Acute bacterial sinusitis -     Amoxicillin -Pot Clavulanate; Take 1 tablet by mouth 2 (two) times daily.  Dispense: 20 tablet; Refill: 0 -     Fluconazole ; Take 1 tablet (150 mg total) by mouth once for 1 dose.  Dispense: 1 tablet; Refill: 1      No orders of the defined types  were placed in this encounter.    Meds ordered this encounter  Medications   DISCONTD: chlorpheniramine-HYDROcodone  (TUSSIONEX) 10-8 MG/5ML    Sig: Take 5 mLs by mouth every 12 (twelve) hours as needed for cough.    Dispense:  120 mL    Refill:  0   amoxicillin -clavulanate (AUGMENTIN ) 875-125 MG tablet    Sig: Take 1 tablet by mouth 2 (two) times daily.    Dispense:  20 tablet    Refill:  0   DISCONTD: fluconazole  (DIFLUCAN ) 150 MG tablet    Sig: Take 1 tablet (150 mg total) by mouth once for 1 dose.    Dispense:  1 tablet    Refill:  0   albuterol  (VENTOLIN  HFA) 108 (90 Base) MCG/ACT inhaler    Sig: Inhale 2 puffs into the lungs every 4 (four) hours as needed for wheezing or shortness of  breath.    Dispense:  8 g    Refill:  0   fluconazole  (DIFLUCAN ) 150 MG tablet    Sig: Take 1 tablet (150 mg total) by mouth once for 1 dose.    Dispense:  1 tablet    Refill:  1   chlorpheniramine-HYDROcodone  (TUSSIONEX) 10-8 MG/5ML    Sig: Take 5 mLs by mouth every 12 (twelve) hours as needed for cough.    Dispense:  120 mL    Refill:  0    The patient was advised to call back or seek an in-person evaluation if the symptoms worsen or if the condition fails to improve as anticipated.  Return if symptoms worsen or fail to improve.  Corean LITTIE Ku, FNP

## 2023-09-14 NOTE — Telephone Encounter (Signed)
 Copied from CRM [id number]. Topic: Clinical - Medication Question >> Sep 14, 2023 12:22 PM Viola F wrote: Reason for CRM: Patient had virtual visit with Corean Ku today, pharmacy says they did not receive the chlorpheniramine-HYDROcodone  (TUSSIONEX) 10-8 MG/5ML [[id number]] and she needs it resent as soon as possible . Please let her know via MyChart when its done

## 2023-09-14 NOTE — Telephone Encounter (Signed)
 Spoke with patient, confirmed medication was resent

## 2023-09-19 ENCOUNTER — Encounter: Payer: Self-pay | Admitting: Family Medicine

## 2023-09-19 DIAGNOSIS — J208 Acute bronchitis due to other specified organisms: Secondary | ICD-10-CM | POA: Insufficient documentation

## 2023-09-19 DIAGNOSIS — B9689 Other specified bacterial agents as the cause of diseases classified elsewhere: Secondary | ICD-10-CM | POA: Insufficient documentation

## 2023-09-20 ENCOUNTER — Encounter: Payer: Self-pay | Admitting: Family Medicine

## 2023-09-20 ENCOUNTER — Telehealth (INDEPENDENT_AMBULATORY_CARE_PROVIDER_SITE_OTHER): Admitting: Family Medicine

## 2023-09-20 VITALS — Ht 66.0 in

## 2023-09-20 DIAGNOSIS — J208 Acute bronchitis due to other specified organisms: Secondary | ICD-10-CM

## 2023-09-20 DIAGNOSIS — B9689 Other specified bacterial agents as the cause of diseases classified elsewhere: Secondary | ICD-10-CM

## 2023-09-20 MED ORDER — FLUCONAZOLE 150 MG PO TABS: ORAL_TABLET | ORAL | 0 refills | Status: DC

## 2023-09-20 MED ORDER — HYDROCODONE BIT-HOMATROP MBR 5-1.5 MG/5ML PO SOLN: 5.0000 mL | Freq: Three times a day (TID) | ORAL | 0 refills | Status: DC | PRN

## 2023-09-20 MED ORDER — DOXYCYCLINE HYCLATE 100 MG PO CAPS: 100.0000 mg | ORAL_CAPSULE | Freq: Two times a day (BID) | ORAL | 0 refills | Status: AC

## 2023-09-20 NOTE — Telephone Encounter (Signed)
 Patient had another virtual visit today

## 2023-09-20 NOTE — Progress Notes (Signed)
 Virtual Visit via Video Note  I connected with Dominique Stevens on 09/20/23 at  1:40 PM EDT by a video enabled telemedicine application and verified that I am speaking with the correct person using two identifiers.  Patient Location: Home Provider Location: Office/Clinic  I discussed the limitations, risks, security, and privacy concerns of performing an evaluation and management service by video and the availability of in person appointments. I also discussed with the patient that there may be a patient responsible charge related to this service. The patient expressed understanding and agreed to proceed.  Subjective: PCP: Rollene Almarie LABOR, MD  Chief Complaint  Patient presents with   Sinus Problem    Pt was seen last week for this, pt stated antibiotics is not working    HPI  48 year old female presents for A/V visit for reevaluation of worsening cough.  Is currently taking Augmentin  and using Tussionex with no relief. Was seen with A/V visit by me on 09/14/2023. She has taken another COVID test since then that was also negative. Reports rib pain with coughing, increased fatigue. Denies abdominal pain, nausea, vomiting, diarrhea, rash, fever, other symptoms. Medical history as below.  ROS: Per HPI  Current Outpatient Medications:    albuterol  (VENTOLIN  HFA) 108 (90 Base) MCG/ACT inhaler, Inhale 2 puffs into the lungs every 4 (four) hours as needed for wheezing or shortness of breath., Disp: 8 g, Rfl: 0   ALPRAZolam  (XANAX ) 1 MG tablet, Take 1 tablet (1 mg total) by mouth 3 (three) times daily as needed for anxiety., Disp: 90 tablet, Rfl: 1   amoxicillin -clavulanate (AUGMENTIN ) 875-125 MG tablet, Take 1 tablet by mouth 2 (two) times daily., Disp: 20 tablet, Rfl: 0   cyclobenzaprine  (FLEXERIL ) 5 MG tablet, Take 1 tablet (5 mg total) by mouth 3 (three) times daily as needed for muscle spasms., Disp: 30 tablet, Rfl: 1   doxycycline  (VIBRAMYCIN ) 100 MG capsule, Take 1 capsule (100 mg  total) by mouth 2 (two) times daily for 7 days., Disp: 14 capsule, Rfl: 0   esomeprazole  (NEXIUM ) 40 MG capsule, TAKE 1 CAPSULE (40 MG TOTAL) BY MOUTH DAILY AT 12 NOON., Disp: 30 capsule, Rfl: 5   Estradiol -Norethindrone  Acet 0.5-0.1 MG tablet, TAKE 1 TABLET BY MOUTH EVERY DAY, Disp: 28 tablet, Rfl: 12   fluconazole  (DIFLUCAN ) 150 MG tablet, Take 150mg  tablet once, if symptoms are still present in 3 days, may take the second tablet., Disp: 2 tablet, Rfl: 0   HYDROcodone  bit-homatropine (HYCODAN) 5-1.5 MG/5ML syrup, Take 5 mLs by mouth every 8 (eight) hours as needed for cough., Disp: 120 mL, Rfl: 0   HYDROcodone -acetaminophen  (NORCO) 10-325 MG tablet, Take 1 tablet by mouth 3 (three) times daily as needed., Disp: 45 tablet, Rfl: 0   Loratadine (CLARITIN PO), Take by mouth., Disp: , Rfl:    losartan  (COZAAR ) 50 MG tablet, TAKE 1 TABLET BY MOUTH EVERY DAY, Disp: 30 tablet, Rfl: 2   magnesium  oxide (MAG-OX) 400 (240 Mg) MG tablet, Take 1 tablet (400 mg total) by mouth daily., Disp: 90 tablet, Rfl: 3   nystatin  cream (MYCOSTATIN ), APPLY TO AFFECTED AREA TWICE A DAY, Disp: 90 g, Rfl: 0   PARoxetine  (PAXIL ) 20 MG tablet, Take 1 tablet (20 mg total) by mouth daily., Disp: 90 tablet, Rfl: 1   propranolol  ER (INDERAL  LA) 60 MG 24 hr capsule, TAKE 2 CAPSULES BY MOUTH DAILY, Disp: 180 capsule, Rfl: 1   traZODone  (DESYREL ) 50 MG tablet, Take 1 tablet (50 mg total) by mouth at  bedtime., Disp: 90 tablet, Rfl: 0  Observations/Objective: Today's Vitals   09/20/23 1333  Height: 5' 6 (1.676 m)   Physical Exam Vitals and nursing note reviewed.  Constitutional:      General: She is not in acute distress. HENT:     Head: Normocephalic and atraumatic.     Mouth/Throat:     Comments: Hoarse voice, sounds congested Eyes:     Extraocular Movements: Extraocular movements intact.  Pulmonary:     Effort: Pulmonary effort is normal.     Comments: Persistent cough throughout the visit, coughing with complete  sentences and laughing Musculoskeletal:     Cervical back: Normal range of motion.  Neurological:     General: No focal deficit present.     Mental Status: She is alert and oriented to person, place, and time.  Psychiatric:        Mood and Affect: Mood normal.        Behavior: Behavior normal.     Assessment and Plan: Acute bacterial bronchitis -     Doxycycline  Hyclate; Take 1 capsule (100 mg total) by mouth 2 (two) times daily for 7 days.  Dispense: 14 capsule; Refill: 0 -     HYDROcodone  Bit-Homatrop MBr; Take 5 mLs by mouth every 8 (eight) hours as needed for cough.  Dispense: 120 mL; Refill: 0 -     Fluconazole ; Take 150mg  tablet once, if symptoms are still present in 3 days, may take the second tablet.  Dispense: 2 tablet; Refill: 0  Declines oral steroids today Change in antimicrobial therapy due to no improvement with Augmentin , Tussionex. Will also send in Hycodan cough syrup, fluconazole  as needed for yeast. Discussed that if this does not help, she will need to come in and be evaluated   Follow Up Instructions: Return if symptoms worsen or fail to improve.   I discussed the assessment and treatment plan with the patient. The patient was provided an opportunity to ask questions, and all were answered. The patient agreed with the plan and demonstrated an understanding of the instructions.   The patient was advised to call back or seek an in-person evaluation if the symptoms worsen or if the condition fails to improve as anticipated.  The above assessment and management plan was discussed with the patient. The patient verbalized understanding of and has agreed to the management plan.   Corean LITTIE Ku, FNP

## 2023-09-20 NOTE — Patient Instructions (Addendum)
 I have sent in a prescription for doxycycline  for you to take twice a day for 10 days.  Take this medication with food as it can upset your stomach if you do not.  I have sent in hydrocodone  cough syrup for you to take 5 mL once daily in the evening as needed for cough.  This medication may make you sleepy.  Do not drive or operate heavy machinery while taking this medication.  I have also sent in Diflucan  for you to take in case of yeast after antibiotic treatment.  If you are having symptoms when you complete antibiotics, may take 1 tablet.  If you are still having symptoms 3 days later, may take the second tablet.  Follow-up with me for new or worsening symptoms.

## 2023-09-24 ENCOUNTER — Other Ambulatory Visit: Payer: Self-pay | Admitting: Internal Medicine

## 2023-09-30 ENCOUNTER — Encounter: Payer: Self-pay | Admitting: Internal Medicine

## 2023-09-30 ENCOUNTER — Other Ambulatory Visit: Payer: Self-pay | Admitting: Nurse Practitioner

## 2023-09-30 DIAGNOSIS — M25511 Pain in right shoulder: Secondary | ICD-10-CM

## 2023-09-30 MED ORDER — HYDROCODONE-ACETAMINOPHEN 10-325 MG PO TABS: 1.0000 | ORAL_TABLET | Freq: Three times a day (TID) | ORAL | 0 refills | Status: DC | PRN

## 2023-09-30 NOTE — Progress Notes (Signed)
 Partial prescription approved and sent in

## 2023-10-01 ENCOUNTER — Encounter: Payer: Self-pay | Admitting: Internal Medicine

## 2023-10-01 NOTE — Telephone Encounter (Signed)
 Please send in refill for hydrocodone 

## 2023-10-05 ENCOUNTER — Encounter: Payer: Self-pay | Admitting: Internal Medicine

## 2023-10-05 ENCOUNTER — Ambulatory Visit: Payer: Self-pay

## 2023-10-05 ENCOUNTER — Other Ambulatory Visit: Payer: Self-pay | Admitting: Internal Medicine

## 2023-10-05 DIAGNOSIS — M25511 Pain in right shoulder: Secondary | ICD-10-CM

## 2023-10-05 NOTE — Telephone Encounter (Unsigned)
 Copied from CRM [id number]. Topic: Clinical - Medication Refill >> Oct 05, 2023  4:58 PM Shereese L wrote: Medication: HYDROcodone -acetaminophen  (NORCO) 10-325 MG tablet  Has the patient contacted their pharmacy? Yes (Agent: If no, request that the patient contact the pharmacy for the refill. If patient does not wish to contact the pharmacy document the reason why and proceed with request.) (Agent: If yes, when and what did the pharmacy advise?)  This is the patient's preferred pharmacy:  [id number] - [place], [place] - [address] AT [facility] [address] [place] [address] Phone: [phone] Fax: [phone]  Is this the correct pharmacy for this prescription? Yes If no, delete pharmacy and type the correct one.   Has the prescription been filled recently? Yes  Is the patient out of the medication? Yes  Has the patient been seen for an appointment in the last year OR does the patient have an upcoming appointment? Yes  Can we respond through MyChart? Yes  Agent: Please be advised that Rx refills may take up to 3 business days. We ask that you follow-up with your pharmacy.

## 2023-10-05 NOTE — Telephone Encounter (Signed)
 Patient is requesting a refill request.

## 2023-10-05 NOTE — Telephone Encounter (Signed)
 FYI Only or Action Required?: Action required by provider: medication refill request.  Patient was last seen in primary care on 09/20/2023 by Alvia Corean CROME, FNP.  Called Nurse Triage reporting Pain.  Symptoms began yesterday.  Interventions attempted: Rest, hydration, or home remedies.  Symptoms are: gradually worsening.  Triage Disposition: See PCP Within 2 Weeks  Patient/caregiver understands and will follow disposition?: No     Copied from CRM [id number]. Topic: Clinical - Red Word Triage >> Oct 05, 2023  3:33 PM Taleah C wrote: Red Word that prompted transfer to Nurse Triage: pain in shoulder and leg, now in her head, classified pain at 8/10, leg is going numb due to not be able to move. Reason for Disposition  Leg pain or muscle cramp is a chronic symptom (recurrent or ongoing AND present > 4 weeks)  Answer Assessment - Initial Assessment Questions Pt states that she had stroke previously, pt states that the pain is worse than normal. States she is back to using her cane. She thinks the pain medicine will allow her get up and moving and get out of bed.   1. ONSET: When did the pain start?      Began to get worse yesterday 2. LOCATION: Where is the pain located?      Right arm and leg 3. PAIN: How bad is the pain?    (Scale 1-10; or mild, moderate, severe)     7/10 4. WORK OR EXERCISE: Has there been any recent work or exercise that involved this part of the body?      Trying to put weight on her leg make sit worse 5. CAUSE: What do you think is causing the leg pain?     Thinks it is from her stroke.  6. OTHER SYMPTOMS: Do you have any other symptoms? (e.g., chest pain, back pain, breathing difficulty, swelling, rash, fever, numbness, weakness)     Headache, bc she can't eat due to pain, States last time she had pain medication was Saturday  Protocols used: Leg Pain-A-AH

## 2023-10-06 ENCOUNTER — Encounter: Payer: Self-pay | Admitting: Internal Medicine

## 2023-10-06 ENCOUNTER — Telehealth: Payer: Self-pay

## 2023-10-06 MED ORDER — BACLOFEN 20 MG PO TABS: 20.0000 mg | ORAL_TABLET | Freq: Three times a day (TID) | ORAL | 0 refills | Status: DC

## 2023-10-06 MED ORDER — LIDOCAINE 5 % EX PTCH: 1.0000 | MEDICATED_PATCH | CUTANEOUS | 0 refills | Status: DC

## 2023-10-06 NOTE — Telephone Encounter (Signed)
 I have called and spoke with patient earlier this morning and informed her that she needs to take the patches and the muscle relaxer pt states that the patches make her have a rash and that the baclofen does not work but I did advise that provider have asked her to at least try as this is safe.

## 2023-10-06 NOTE — Telephone Encounter (Signed)
 Provider has already addressed this situation with the patient. She need to try the baclofen that was sent in for her and if this does not help then please inform us  .

## 2023-10-06 NOTE — Telephone Encounter (Signed)
 Pt has sent a my chart message and provider will respond through that

## 2023-10-07 NOTE — Telephone Encounter (Signed)
 Pt has been booked not sure if you needing this back

## 2023-10-08 ENCOUNTER — Telehealth (INDEPENDENT_AMBULATORY_CARE_PROVIDER_SITE_OTHER): Admitting: Internal Medicine

## 2023-10-08 ENCOUNTER — Other Ambulatory Visit: Payer: Self-pay

## 2023-10-08 ENCOUNTER — Encounter: Payer: Self-pay | Admitting: Internal Medicine

## 2023-10-08 DIAGNOSIS — M25511 Pain in right shoulder: Secondary | ICD-10-CM

## 2023-10-08 DIAGNOSIS — M79604 Pain in right leg: Secondary | ICD-10-CM | POA: Diagnosis not present

## 2023-10-08 DIAGNOSIS — G8929 Other chronic pain: Secondary | ICD-10-CM | POA: Diagnosis not present
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
## 2023-10-08 NOTE — Assessment & Plan Note (Signed)
 Low back arthritis with chronic pain may contribute to leg pain. Previous imaging indicated significant arthritis. Refer to sports medicine for evaluation and potential interventions to alleviate pain and improve function.

## 2023-10-10 ENCOUNTER — Encounter: Payer: Self-pay | Admitting: Internal Medicine

## 2023-10-10 ENCOUNTER — Telehealth: Admitting: Family

## 2023-10-10 DIAGNOSIS — R110 Nausea: Secondary | ICD-10-CM

## 2023-10-10 DIAGNOSIS — F1193 Opioid use, unspecified with withdrawal: Secondary | ICD-10-CM

## 2023-10-10 MED ORDER — PROMETHAZINE HCL 25 MG PO TABS: 25.0000 mg | ORAL_TABLET | Freq: Three times a day (TID) | ORAL | 0 refills | Status: DC | PRN

## 2023-10-10 NOTE — Progress Notes (Signed)
 Virtual Visit Consent   Dominique Stevens, you are scheduled for a virtual visit with a [facility] provider today. Just as with appointments in the office, your consent must be obtained to participate. Your consent will be active for this visit and any virtual visit you may have with one of our providers in the next 365 days. If you have a MyChart account, a copy of this consent can be sent to you electronically.  As this is a virtual visit, video technology does not allow for your provider to perform a traditional examination. This may limit your provider's ability to fully assess your condition. If your provider identifies any concerns that need to be evaluated in person or the need to arrange testing (such as labs, EKG, etc.), we will make arrangements to do so. Although advances in technology are sophisticated, we cannot ensure that it will always work on either your end or our end. If the connection with a video visit is poor, the visit may have to be switched to a telephone visit. With either a video or telephone visit, we are not always able to ensure that we have a secure connection.  By engaging in this virtual visit, you consent to the provision of healthcare and authorize for your insurance to be billed (if applicable) for the services provided during this visit. Depending on your insurance coverage, you may receive a charge related to this service.  I need to obtain your verbal consent now. Are you willing to proceed with your visit today? Dominique Stevens has provided verbal consent on 10/10/2023 for a virtual visit (video or telephone). Dominique Learn, FNP  Date: 10/10/2023 5:06 PM   Virtual Visit via Video Note   I, Dominique Stevens, connected with  Dominique Stevens  ([id number], May 22, 1975) on 10/10/23 at  5:00 PM EDT by a video-enabled telemedicine application and verified that I am speaking with the correct person using two identifiers.  Location: Patient: Virtual Visit Location Patient:  Home Provider: Virtual Visit Location Provider: Home Office   I discussed the limitations of evaluation and management by telemedicine and the availability of in person appointments. The patient expressed understanding and agreed to proceed.    History of Present Illness: Dominique Stevens is a 48 y.o. who identifies as a female who was assigned female at birth, and is being seen today for nausea and vomiting. She saw her PCP on Friday and was diagnosed with opioid withdraw symptoms after stopping hydrocodone .    She reports shaking of bilateral hands that is worse when she is trying to drink.   HPI: HPI  Problems:  Patient Active Problem List   Diagnosis Date Noted   Right leg pain 10/08/2023   Panic disorder 07/29/2023   Primary hypertension 07/29/2023   Primary insomnia 07/29/2023   Stress reaction 07/29/2023   Right shoulder pain 06/09/2023   PRES (posterior reversible encephalopathy syndrome) 05/14/2023   Breast pain 12/25/2021   GERD (gastroesophageal reflux disease) 09/25/2021   Anxiety 09/25/2021   Hot flashes 09/25/2021   Routine general medical examination at a health care facility 09/25/2021   Nicotine abuse 09/25/2021    Allergies: No Known Allergies Medications:  Current Outpatient Medications:    promethazine  (PHENERGAN ) 25 MG tablet, Take 1 tablet (25 mg total) by mouth every 8 (eight) hours as needed for nausea or vomiting., Disp: 20 tablet, Rfl: 0   albuterol  (VENTOLIN  HFA) 108 (90 Base) MCG/ACT inhaler, Inhale 2 puffs into the lungs every 4 (four) hours as needed for  wheezing or shortness of breath., Disp: 8 g, Rfl: 0   ALPRAZolam  (XANAX ) 1 MG tablet, Take 1 tablet (1 mg total) by mouth 3 (three) times daily as needed for anxiety., Disp: 90 tablet, Rfl: 1   baclofen (LIORESAL) 20 MG tablet, Take 1 tablet (20 mg total) by mouth 3 (three) times daily., Disp: 30 each, Rfl: 0   esomeprazole  (NEXIUM ) 40 MG capsule, TAKE 1 CAPSULE (40 MG TOTAL) BY MOUTH DAILY AT 12 NOON.,  Disp: 30 capsule, Rfl: 5   Estradiol -Norethindrone  Acet 0.5-0.1 MG tablet, TAKE 1 TABLET BY MOUTH EVERY DAY, Disp: 28 tablet, Rfl: 12   lidocaine  (LIDODERM ) 5 %, Place 1 patch onto the skin daily. Remove & Discard patch within 12 hours or as directed by MD, Disp: 30 patch, Rfl: 0   Loratadine (CLARITIN PO), Take by mouth., Disp: , Rfl:    losartan  (COZAAR ) 50 MG tablet, TAKE 1 TABLET BY MOUTH EVERY DAY, Disp: 30 tablet, Rfl: 2   magnesium  oxide (MAG-OX) 400 (240 Mg) MG tablet, Take 1 tablet (400 mg total) by mouth daily., Disp: 90 tablet, Rfl: 3   nystatin  cream (MYCOSTATIN ), APPLY TO AFFECTED AREA TWICE A DAY, Disp: 90 g, Rfl: 0   PARoxetine  (PAXIL ) 20 MG tablet, Take 1 tablet (20 mg total) by mouth daily., Disp: 90 tablet, Rfl: 1   propranolol  ER (INDERAL  LA) 60 MG 24 hr capsule, TAKE 2 CAPSULES BY MOUTH DAILY, Disp: 180 capsule, Rfl: 1  Observations/Objective: Patient is well-developed, well-nourished in no acute distress.  Resting comfortably  at home.  Head is normocephalic, atraumatic.  No labored breathing.  Speech is clear and coherent with logical content.  Patient is alert and oriented at baseline.  Shaking of hand when drinking No acute distress   Assessment and Plan: 1. Nausea (Primary) - promethazine  (PHENERGAN ) 25 MG tablet; Take 1 tablet (25 mg total) by mouth every 8 (eight) hours as needed for nausea or vomiting.  Dispense: 20 tablet; Refill: 0  2. Withdrawal from opioids (HCC)  Unsure if this related to opioids use or not. Recommend close follow up with PCP.  Will give phenergan . Pt states zofran  does not work for her.  Force fluids Avoid opioids at this time   Follow Up Instructions: I discussed the assessment and treatment plan with the patient. The patient was provided an opportunity to ask questions and all were answered. The patient agreed with the plan and demonstrated an understanding of the instructions.  A copy of instructions were sent to the patient via  MyChart unless otherwise noted below.     The patient was advised to call back or seek an in-person evaluation if the symptoms worsen or if the condition fails to improve as anticipated.    Dominique Learn, FNP

## 2023-10-10 NOTE — Patient Instructions (Signed)
 Opioid Withdrawal Treatment: What to Expect Opioid withdrawal can happen if you've been taking opioids for a period of time and then you suddenly stop. This can cause many different symptoms. Symptoms can be mild, but sometimes they can be very bad. Opioid withdrawal can happen when: You stop taking the opioid, or you decrease the amount of opioids you take. You stop taking a prescription opioid as told by your health care provider. You take more opioids than you were told, or you take them for a different purpose. You were given a medicine to block the effects of opioids. These are called opioid antagonists. Opioid withdrawal is not usually life-threatening, but it can be very uncomfortable. Treatment makes it easier for you to go through withdrawal. How is opioid withdrawal treated? Medicines Opioid or opioid-like medicines are the best treatments. They're used to block withdrawal symptoms. The dose is reduced over time. Your provider may give you these medicines to block withdrawal: Methadone. The dose is slowly lowered over 6-10 days. Buprenorphine. The dose is slowly lowered over 3-5 days. In some cases, it may be lowered very slowly over 30 days. You may also be given medicines to: Treat your symptoms. Treat an overdose. This is when you take too much opioids. To treat an overdose, your provider may give you an opioid antagonist, like naloxone. Naloxone improves your breathing if it has slowed down or stopped. Other treatments  Counseling. This is also called talk therapy. This is provided by mental health providers with training in opioid use disorder. Joining support groups. Support groups are run by people who have quit using opioids. Support groups give emotional support, advice, and guidance. In a support group, you can talk to present and past opioid users and those who have gone through treatment. Seeing your provider about chronic pain. Ask your provider about: Making a  treatment plan to help manage pain. A pain rehabilitation program. A referral to a chronic pain provider. Follow these instructions at home: Take your medicines only as told. Check with your provider before starting new medicines, herbs, or supplements. Do not start using an opioid medicine again without talking to a provider. You may be at risk for overdose. Keep all follow-up visits. Your treatment plan may change over time. Contact a health care provider if: You need more help stopping the use of an opioid medicine. You still have symptoms of withdrawal after treatment. You start taking opioids again. You feel weak or dizzy. You have a fever. You feel sad, hopeless, or anxious. Get help right away if: You have chest pain. You have trouble breathing. You can't stop throwing up. You faint. You feel like you may hurt yourself or others. You have thoughts about taking your own life. You have other thoughts or feelings that worry you. These symptoms may be an emergency. Take one of these steps right away: Go to your nearest emergency room. Call 911. Contact the Suicide Crisis Lifeline (24/7, free and confidential): Call or text 988. Chat online at chat.NewsActor.se. For Veterans and their loved ones: Call 988 and press 1. Text the [employer] at 787-747-3950. Chat online at ReservationsList.si. This information is not intended to replace advice given to you by your health care provider. Make sure you discuss any questions you have with your health care provider. Document Revised: 03/26/2023 Document Reviewed: 03/26/2023 Elsevier Patient Education  2025 [employer].

## 2023-10-12 ENCOUNTER — Ambulatory Visit: Admitting: Family Medicine

## 2023-10-12 ENCOUNTER — Telehealth: Payer: Self-pay

## 2023-10-12 ENCOUNTER — Encounter: Payer: Self-pay | Admitting: Internal Medicine
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
## 2023-10-12 NOTE — Telephone Encounter (Signed)
 Copied from CRM #[id number]. Topic: General - Other >> Oct 12, 2023  3:22 PM Frederich PARAS wrote: Reason for CRM: pt calling to speak to manager or higher up. Cal adv tht the manager was in a meeting. Please give the pt a call back, the pt was upset and wants a higher up to call asap. Callback# [phone]

## 2023-10-12 NOTE — Progress Notes (Deleted)
   LILLETTE Ileana Collet, PhD, LAT, ATC acting as a scribe for Artist Lloyd, MD.  Dominique Stevens is a 48 y.o. female who presents to [facility] today for R shoulder pain   Pertinent review of systems: ***  Relevant historical information: ***   Exam:  There were no vitals taken for this visit. General: Well Developed, well nourished, and in no acute distress.   MSK: ***    Lab and Radiology Results No results found for this or any previous visit (from the past 72 hours). No results found.     Assessment and Plan: 48 y.o. female with ***   PDMP not reviewed this encounter. No orders of the defined types were placed in this encounter.  No orders of the defined types were placed in this encounter.    Discussed warning signs or symptoms. Please see discharge instructions. Patient expresses understanding.   ***

## 2023-10-12 NOTE — Telephone Encounter (Signed)
 Luke has been advised of this
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
## 2023-10-12 NOTE — Telephone Encounter (Signed)
 Called patient and left voicemail.

## 2023-10-13 ENCOUNTER — Encounter: Payer: Self-pay | Admitting: Internal Medicine

## 2023-10-13 NOTE — Telephone Encounter (Unsigned)
 Copied from CRM #[id number]. Topic: General - Call Back - No Documentation >> Oct 13, 2023  2:51 PM [name] wrote: Reason for CRM: Pt returning call for Dominique Stevens. Pt hung up while holding.
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
## 2023-10-13 NOTE — Progress Notes (Unsigned)
 [facility] [address] [place] [address] Phone: [phone]   Assessment and Plan:     1. Neck pain (Primary) 2. Chronic bilateral thoracic back pain 3. Chronic bilateral low back pain with right-sided sciatica 4. Degeneration of intervertebral disc of lumbar region with discogenic back pain and lower extremity pain 5. Chronic right shoulder pain -Chronic with exacerbation, initial sports medicine visit - Patient presents with multiple areas of musculoskeletal pain with most prominent being low back pain with numbness/tingling/weakness in the right lower extremity, right shoulder pain.  Patient has had progressive weakness leading to increased frequency of falls over the past several months - Recommend MRI of lumbar spine to further evaluate for neurologic impingement based on prior CT lumbar spine, red flags including progressive weakness in right lower extremity, history of urinary incontinence, pain >6/10, pain with day-to-day activities - Start Celebrex  200 mg daily x2 weeks.  If still having pain after 2 weeks, complete 3rd-week of NSAID. May use remaining NSAID as needed once daily for pain control.  Do not to use additional over-the-counter NSAIDs (ibuprofen, naproxen, Advil, Aleve, etc.) while taking prescription NSAIDs.  May use Tylenol  [phone] mg 2 to 3 times a day for breakthrough pain.     Patient was accompanied by her mother throughout entirety of office visit.  Pertinent previous records reviewed include video visit 10/10/2023, telemedicine visit 10/08/2023, telephone encounter 10/12/2023   Follow Up: 5 days after MRI to review results and discuss treatment plan.  Could consider epidural CSI if appropriate.  Could consider right shoulder x-ray and subacromial CSI if appropriate.   Subjective:   I, Chestine Reeves, am serving as a Neurosurgeon for Doctor Morene Mace  Chief Complaint: right shoulder and leg pain    HPI:   10/14/2023 Patient is a 48 year old female with right shoulder and leg pain. Patient states right shoulder pain 6 months ago. Decreased ROM. States she had a fall 3 weeks ago. Pain radiates up the neck and down to the elbow. Tylenol  and ibu for the pain does not help. Decreased grip strength. States she had a stroke. PCP says no stroke.  Left leg goes out on her 6 months ago. Pain has gotten worse. She is in a wheelchair. Quad pain that radiates down to her ankle. Decreased ROM. Notes she has numbness and tingling. Does also use a  one prong cane   State she is supposed to have low back checked for arthritis. PCP told her she would need a shot in her back   Relevant Historical Information: Hypertension, GERD  Additional pertinent review of systems negative.   Current Outpatient Medications:    celecoxib  (CELEBREX ) 200 MG capsule, Take 1 capsule (200 mg total) by mouth 2 (two) times daily., Disp: 60 capsule, Rfl: 0   albuterol  (VENTOLIN  HFA) 108 (90 Base) MCG/ACT inhaler, Inhale 2 puffs into the lungs every 4 (four) hours as needed for wheezing or shortness of breath., Disp: 8 g, Rfl: 0   ALPRAZolam  (XANAX ) 1 MG tablet, Take 1 tablet (1 mg total) by mouth 3 (three) times daily as needed for anxiety., Disp: 90 tablet, Rfl: 1   baclofen (LIORESAL) 20 MG tablet, Take 1 tablet (20 mg total) by mouth 3 (three) times daily., Disp: 30 each, Rfl: 0   esomeprazole  (NEXIUM ) 40 MG capsule, TAKE 1 CAPSULE (40 MG TOTAL) BY MOUTH DAILY AT 12 NOON., Disp: 30 capsule, Rfl: 5   Estradiol -Norethindrone  Acet 0.5-0.1 MG  tablet, TAKE 1 TABLET BY MOUTH EVERY DAY, Disp: 28 tablet, Rfl: 12   lidocaine  (LIDODERM ) 5 %, Place 1 patch onto the skin daily. Remove & Discard patch within 12 hours or as directed by MD, Disp: 30 patch, Rfl: 0   Loratadine (CLARITIN PO), Take by mouth., Disp: , Rfl:    losartan  (COZAAR ) 50 MG tablet, TAKE 1 TABLET BY MOUTH EVERY DAY, Disp: 30 tablet, Rfl: 2   magnesium  oxide (MAG-OX)  400 (240 Mg) MG tablet, Take 1 tablet (400 mg total) by mouth daily., Disp: 90 tablet, Rfl: 3   nystatin  cream (MYCOSTATIN ), APPLY TO AFFECTED AREA TWICE A DAY, Disp: 90 g, Rfl: 0   PARoxetine  (PAXIL ) 20 MG tablet, Take 1 tablet (20 mg total) by mouth daily., Disp: 90 tablet, Rfl: 1   promethazine  (PHENERGAN ) 25 MG tablet, Take 1 tablet (25 mg total) by mouth every 8 (eight) hours as needed for nausea or vomiting., Disp: 20 tablet, Rfl: 0   propranolol  ER (INDERAL  LA) 60 MG 24 hr capsule, TAKE 2 CAPSULES BY MOUTH DAILY, Disp: 180 capsule, Rfl: 1   Objective:     Vitals:   10/14/23 0859  BP: 134/82  Pulse: 88  SpO2: 99%  Weight: 172 lb (78 kg)  Height: 5' 6 (1.676 m)      Body mass index is 27.76 kg/m.    Physical Exam:    Gen: Appears well, nad, nontoxic and pleasant Psych: Alert and oriented, appropriate mood and affect Neuro: sensation intact, strength is 3/5 in right lower extremity, 5/5 in left lower extremity. Skin: no susupicious lesions or rashes  Back - Normal skin, Spine with normal alignment and no deformity.     tenderness to vertebral process palpation.   Paraspinous muscles are   tender and without spasm  TTP gluteal musculature Straight leg raise positive right   Piriformis Test negative Gait antalgic, using wheelchair in office    Right shoulder:  No deformity, swelling or muscle wasting No scapular winging FF 60, abd 50, int 30, ext 60 TTP nonspecifically globally  Electronically signed by:  Odis Mace [facility] 9:45 AM 10/14/23
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
## 2023-10-14 ENCOUNTER — Ambulatory Visit: Admitting: Sports Medicine

## 2023-10-14 VITALS — BP 134/82 | HR 88 | Ht 66.0 in | Wt 172.0 lb

## 2023-10-14 DIAGNOSIS — M542 Cervicalgia: Secondary | ICD-10-CM | POA: Diagnosis not present

## 2023-10-14 DIAGNOSIS — M5441 Lumbago with sciatica, right side: Secondary | ICD-10-CM

## 2023-10-14 DIAGNOSIS — M546 Pain in thoracic spine: Secondary | ICD-10-CM

## 2023-10-14 DIAGNOSIS — M25511 Pain in right shoulder: Secondary | ICD-10-CM

## 2023-10-14 DIAGNOSIS — G8929 Other chronic pain: Secondary | ICD-10-CM | POA: Diagnosis not present

## 2023-10-14 DIAGNOSIS — M51362 Other intervertebral disc degeneration, lumbar region with discogenic back pain and lower extremity pain: Secondary | ICD-10-CM

## 2023-10-14 MED ORDER — CELECOXIB 200 MG PO CAPS: 200.0000 mg | ORAL_CAPSULE | Freq: Two times a day (BID) | ORAL | 0 refills | Status: DC

## 2023-10-14 NOTE — Patient Instructions (Addendum)
 Mri lumbar   - Start Celebrex  200 mg  x2 daily for 2 weeks.  If still having pain after 2 weeks, complete 3rd-week of NSAID. May use remaining NSAID as needed once daily for pain control.  Do not to use additional over-the-counter NSAIDs (ibuprofen, naproxen, Advil, Aleve, etc.) while taking prescription NSAIDs.  May use Tylenol  [phone] mg 2 to 3 times a day for breakthrough pain.  Always take mediation with food   Follow up 5 days after to discuss results

## 2023-10-18 ENCOUNTER — Ambulatory Visit: Admitting: Sports Medicine

## 2023-10-19 ENCOUNTER — Other Ambulatory Visit: Payer: Self-pay | Admitting: Internal Medicine

## 2023-10-19 ENCOUNTER — Encounter: Payer: Self-pay | Admitting: Internal Medicine

## 2023-10-19 ENCOUNTER — Telehealth: Admitting: Internal Medicine

## 2023-10-19 DIAGNOSIS — F419 Anxiety disorder, unspecified: Secondary | ICD-10-CM | POA: Diagnosis not present

## 2023-10-19 DIAGNOSIS — F410 Panic disorder [episodic paroxysmal anxiety] without agoraphobia: Secondary | ICD-10-CM

## 2023-10-19 DIAGNOSIS — M25511 Pain in right shoulder: Secondary | ICD-10-CM

## 2023-10-19 DIAGNOSIS — G8929 Other chronic pain: Secondary | ICD-10-CM | POA: Diagnosis not present

## 2023-10-19 MED ORDER — ALPRAZOLAM 1 MG PO TABS: 1.0000 mg | ORAL_TABLET | Freq: Three times a day (TID) | ORAL | 2 refills | Status: DC | PRN
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
## 2023-10-20 ENCOUNTER — Encounter: Payer: Self-pay | Admitting: Internal Medicine

## 2023-10-20 NOTE — Telephone Encounter (Signed)
 I have sent over the refills request to provider

## 2023-10-21 ENCOUNTER — Telehealth: Payer: Self-pay

## 2023-10-21 NOTE — Telephone Encounter (Signed)
 I have written the appeal as expedited with the information provider has written below.

## 2023-10-21 NOTE — Telephone Encounter (Signed)
 Received faxed denial for MRI lumbar spine. Insurance is stating imaging requires 6 weeks of provider directed treatment to be completed, and has to have been completed with in the last 3 months.   I do not have that information to give insurance so I cannot do an appeal and I was going to set up a peer to peer but I did not see that she has seen us  more than once.   Not sure how you would like to proceed?

## 2023-10-28 ENCOUNTER — Telehealth: Payer: Self-pay | Admitting: Sports Medicine

## 2023-10-28 ENCOUNTER — Other Ambulatory Visit: Payer: Self-pay | Admitting: Sports Medicine

## 2023-10-28 MED ORDER — CELECOXIB 200 MG PO CAPS: 200.0000 mg | ORAL_CAPSULE | Freq: Every day | ORAL | 0 refills | Status: DC | PRN

## 2023-10-28 NOTE — Progress Notes (Signed)
 Refill placed

## 2023-10-28 NOTE — Telephone Encounter (Signed)
 Refill placed

## 2023-10-28 NOTE — Telephone Encounter (Signed)
 Patient called requesting a refill on celecoxib  (CELEBREX ) 200 MG capsule.  Pharmacy: [facility]. [place]

## 2023-10-29 ENCOUNTER — Ambulatory Visit
Admission: RE | Admit: 2023-10-29 | Discharge: 2023-10-29 | Disposition: A | Discharge status: Home / Self Care | Source: Ambulatory Visit | Attending: Sports Medicine | Admitting: Sports Medicine

## 2023-10-29 DIAGNOSIS — M51362 Other intervertebral disc degeneration, lumbar region with discogenic back pain and lower extremity pain: Secondary | ICD-10-CM

## 2023-10-29 DIAGNOSIS — G8929 Other chronic pain: Secondary | ICD-10-CM

## 2023-11-02 ENCOUNTER — Ambulatory Visit: Payer: Self-pay | Admitting: Sports Medicine

## 2023-11-03 ENCOUNTER — Other Ambulatory Visit: Payer: Self-pay | Admitting: Sports Medicine

## 2023-11-03 DIAGNOSIS — M51362 Other intervertebral disc degeneration, lumbar region with discogenic back pain and lower extremity pain: Secondary | ICD-10-CM

## 2023-11-03 DIAGNOSIS — G8929 Other chronic pain: Secondary | ICD-10-CM

## 2023-11-03 NOTE — Progress Notes (Signed)
 Patient called and vocalized understanding referral placed

## 2023-11-03 NOTE — Progress Notes (Signed)
 Patient called and vocalized understanding left L5-S1

## 2023-11-04 ENCOUNTER — Other Ambulatory Visit: Payer: Self-pay | Admitting: Family Medicine

## 2023-11-04 ENCOUNTER — Other Ambulatory Visit: Payer: Self-pay | Admitting: Internal Medicine

## 2023-11-04 ENCOUNTER — Encounter: Payer: Self-pay | Admitting: Sports Medicine

## 2023-11-04 DIAGNOSIS — B9689 Other specified bacterial agents as the cause of diseases classified elsewhere: Secondary | ICD-10-CM

## 2023-11-08 NOTE — Progress Notes (Signed)
 [facility] [address] [place] [address] Phone: [phone]   Assessment and Plan:     1. Chronic bilateral low back pain with right-sided sciatica (Primary) 2. Degeneration of intervertebral disc of lumbar region with discogenic back pain and lower extremity pain -Chronic with exacerbation, subsequent visit - Overall moderate, 40+ percent improvement in low back pain, radicular symptoms after epidural CSI performed on 11/09/23 to L5-S1 disc herniation seen on lumbar MRI at L5-S1.  Patient had similar but mildly decreased need for pain medication comparatively to before injection, and improved function after injection - While patient has had some improvement, I believe she would benefit from repeat epidural CSI to L5-S1.  Order placed - May continue Celebrex  200 mg daily as needed for pain relief  4. Nausea without vomiting -Acute - Most likely related to continued daily Celebrex  use - Recommend continuing to use Celebrex  with food and reflux medication - May use Zofran  as needed for breakthrough nausea   3. Chronic right shoulder pain - Chronic with exacerbation, subsequent visit - Continued right shoulder pain - Patient was unable to continue PT due to cost - Start HEP for shoulder - Continue Celebrex  200 mg daily as needed for pain relief  15 additional minutes spent for educating Therapeutic Home Exercise Program.  This included exercises focusing on stretching, strengthening, with focus on eccentric aspects.   Long term goals include an improvement in range of motion, strength, endurance as well as avoiding reinjury. Patient's frequency would include in 1-2 times a day, 3-5 times a week for a duration of 6-12 weeks. Proper technique shown and discussed handout in great detail with ATC.  All questions were discussed and answered.    Pertinent previous records reviewed include epidural procedure note, lumbar MRI   Follow  Up: 2 weeks after epidural to review benefit.  Could consider repeat epidural CSI if necessary   Subjective:   I, Dominique Stevens, am serving as a neurosurgeon for Doctor Morene Mace   Chief Complaint: right shoulder and leg pain    HPI:    10/14/2023 Patient is a 48 year old female with right shoulder and leg pain. Patient states right shoulder pain 6 months ago. Decreased ROM. States she had a fall 3 weeks ago. Pain radiates up the neck and down to the elbow. Tylenol  and ibu for the pain does not help. Decreased grip strength. States she had a stroke. PCP says no stroke.   Left leg goes out on her 6 months ago. Pain has gotten worse. She is in a wheelchair. Quad pain that radiates down to her ankle. Decreased ROM. Notes she has numbness and tingling. Does also use a  one prong cane    State she is supposed to have low back checked for arthritis. PCP told her she would need a shot in her back   11/15/2023 Patient states she is not in wheelchair. Numbness as gone away. She is using a 1 prong cane. Does endorse right hip pain. Notes she is nauseous everyday since epidural.  40% improvement    Relevant Historical Information: Hypertension, GERD  Additional pertinent review of systems negative.   Current Outpatient Medications:    ondansetron  (ZOFRAN ) 4 MG tablet, Take 1 tablet (4 mg total) by mouth every 8 (eight) hours as needed for nausea or vomiting., Disp: 20 tablet, Rfl: 0   albuterol  (VENTOLIN  HFA) 108 (90 Base) MCG/ACT inhaler, INHALE 2 PUFFS INTO THE LUNGS EVERY  4 HOURS AS NEEDED FOR WHEEZING OR SHORTNESS OF BREATH, Disp: 6.7 g, Rfl: 0   ALPRAZolam  (XANAX ) 1 MG tablet, Take 1 tablet (1 mg total) by mouth 3 (three) times daily as needed for anxiety., Disp: 90 tablet, Rfl: 2   baclofen (LIORESAL) 20 MG tablet, TAKE 1 TABLET BY MOUTH THREE TIMES A DAY, Disp: 90 tablet, Rfl: 0   celecoxib  (CELEBREX ) 200 MG capsule, Take 1 capsule (200 mg total) by mouth daily as needed., Disp: 30  capsule, Rfl: 0   esomeprazole  (NEXIUM ) 40 MG capsule, TAKE 1 CAPSULE (40 MG TOTAL) BY MOUTH DAILY AT 12 NOON., Disp: 30 capsule, Rfl: 5   Estradiol -Norethindrone  Acet 0.5-0.1 MG tablet, TAKE 1 TABLET BY MOUTH EVERY DAY, Disp: 28 tablet, Rfl: 1   lidocaine  (LIDODERM ) 5 %, PLACE 1 PATCH ONTO THE SKIN DAILY. REMOVE & DISCARD PATCH WITHIN 12 HOURS OR AS DIRECTED BY MD, Disp: 30 patch, Rfl: 0   Loratadine (CLARITIN PO), Take by mouth., Disp: , Rfl:    losartan  (COZAAR ) 50 MG tablet, TAKE 1 TABLET BY MOUTH EVERY DAY, Disp: 30 tablet, Rfl: 2   magnesium  oxide (MAG-OX) 400 (240 Mg) MG tablet, Take 1 tablet (400 mg total) by mouth daily., Disp: 90 tablet, Rfl: 3   nystatin  cream (MYCOSTATIN ), APPLY TO AFFECTED AREA TWICE A DAY, Disp: 90 g, Rfl: 0   PARoxetine  (PAXIL ) 20 MG tablet, Take 1 tablet (20 mg total) by mouth daily., Disp: 90 tablet, Rfl: 1   promethazine  (PHENERGAN ) 25 MG tablet, Take 1 tablet (25 mg total) by mouth every 8 (eight) hours as needed for nausea or vomiting., Disp: 20 tablet, Rfl: 0   propranolol  ER (INDERAL  LA) 60 MG 24 hr capsule, TAKE 2 CAPSULES BY MOUTH DAILY, Disp: 180 capsule, Rfl: 1   Objective:     Vitals:   11/15/23 1039  BP: (!) 122/2  Pulse: 72  SpO2: 99%  Weight: 182 lb (82.6 kg)  Height: 5' 6 (1.676 m)      Body mass index is 29.38 kg/m.    Physical Exam:    Gen: Appears well, nad, nontoxic and pleasant Psych: Alert and oriented, appropriate mood and affect Neuro: sensation intact, strength is 4/5 in right lower extremity, 5/5 in left lower extremity. Skin: no susupicious lesions or rashes   Back - Normal skin, Spine with normal alignment and no deformity.     tenderness to vertebral process palpation.   Paraspinous muscles are   tender and without spasm  TTP gluteal musculature Straight leg raise positive right   Piriformis Test negative Gait antalgic, using 1 pronged cane     Right shoulder:  No deformity, swelling or muscle wasting No  scapular winging FF 60, abd 50, int 30, ext 60 TTP nonspecifically globally    Electronically signed by:  Odis Mace D.[facility]MENTE[facility] 12:29 PM 11/15/23

## 2023-11-08 NOTE — Discharge Instructions (Signed)

## 2023-11-09 ENCOUNTER — Ambulatory Visit
Admission: RE | Admit: 2023-11-09 | Discharge: 2023-11-09 | Disposition: A | Discharge status: Home / Self Care | Source: Ambulatory Visit | Attending: Sports Medicine | Admitting: Sports Medicine

## 2023-11-09 DIAGNOSIS — G8929 Other chronic pain: Secondary | ICD-10-CM

## 2023-11-09 DIAGNOSIS — M51362 Other intervertebral disc degeneration, lumbar region with discogenic back pain and lower extremity pain: Secondary | ICD-10-CM

## 2023-11-09 MED ORDER — IOPAMIDOL (ISOVUE-M 200) INJECTION 41%
1.0000 mL | Freq: Once | INTRAMUSCULAR | Status: AC
  Administered 2023-11-09: 1 mL via EPIDURAL

## 2023-11-09 MED ORDER — METHYLPREDNISOLONE ACETATE 40 MG/ML INJ SUSP (RADIOLOG
80.0000 mg | Freq: Once | INTRAMUSCULAR | Status: AC
  Administered 2023-11-09: 80 mg via EPIDURAL

## 2023-11-13 ENCOUNTER — Other Ambulatory Visit: Payer: Self-pay | Admitting: Internal Medicine

## 2023-11-15 ENCOUNTER — Ambulatory Visit: Admitting: Sports Medicine

## 2023-11-15 VITALS — BP 122/2 | HR 72 | Ht 66.0 in | Wt 182.0 lb

## 2023-11-15 DIAGNOSIS — M51362 Other intervertebral disc degeneration, lumbar region with discogenic back pain and lower extremity pain: Secondary | ICD-10-CM | POA: Diagnosis not present

## 2023-11-15 DIAGNOSIS — M25511 Pain in right shoulder: Secondary | ICD-10-CM

## 2023-11-15 DIAGNOSIS — R110 Nausea: Secondary | ICD-10-CM | POA: Diagnosis not present

## 2023-11-15 DIAGNOSIS — G8929 Other chronic pain: Secondary | ICD-10-CM | POA: Diagnosis not present

## 2023-11-15 MED ORDER — ONDANSETRON HCL 4 MG PO TABS: 4.0000 mg | ORAL_TABLET | Freq: Three times a day (TID) | ORAL | 0 refills | Status: DC | PRN

## 2023-11-15 NOTE — Patient Instructions (Addendum)
 Zofran  4 mg every 8 hours as needed   Epidural right L5-S1  Shoulder HEP   Follow up 2 weeks after epidural to discuss results

## 2023-11-16 ENCOUNTER — Other Ambulatory Visit

## 2023-11-17 ENCOUNTER — Telehealth: Payer: Self-pay

## 2023-11-17 ENCOUNTER — Other Ambulatory Visit: Payer: Self-pay | Admitting: Internal Medicine

## 2023-11-17 ENCOUNTER — Ambulatory Visit: Payer: Self-pay

## 2023-11-17 DIAGNOSIS — I6783 Posterior reversible encephalopathy syndrome: Secondary | ICD-10-CM

## 2023-11-17 NOTE — Telephone Encounter (Signed)
 Patient has been dismissed from department on 10/08 and patient was made aware, has been over 30 days since dismissal. Unable to address concern, patient must find a new PCP.

## 2023-11-17 NOTE — Telephone Encounter (Signed)
 Spoke with previous PCP Dr.Crawford regarding refill. She states there is nothing we can do for patient since she has been dismissed from the department and it has been over 30 days. Patient has to establish care elsewhere.

## 2023-11-17 NOTE — Telephone Encounter (Signed)
 FYI Only or Action Required?: Action required by provider: medication refill request.  Patient was last seen in primary care on 10/19/2023 by Rollene Almarie LABOR, MD.  Called Nurse Triage reporting Hypertension.  Triage Disposition: Call PCP Now  Patient/caregiver understands and will follow disposition?: Yes          Copied from CRM [id number]. Topic: Clinical - Red Word Triage >> Nov 17, 2023  9:39 AM Dominique Stevens wrote: Red Word that prompted transfer to Nurse Triage: blood pressure is high  134/92 Reason for Disposition  [1] Prescription refill request for ESSENTIAL medicine (i.e., likelihood of harm to patient if not taken) AND [2] triager unable to refill per department policy  Answer Assessment - Initial Assessment Questions Pt is requesting her losartan  be filled until Jan 2026 when she can get in to establish care with another provider. Pt was discharged from this office.   Elevated BP 134/92 this morning;pt states she got a text that her BP medication was denied (losaratan 50 mg) Today is first day pt has noticed it being elevated   OTHER SYMPTOMS: Do you have any symptoms? (e.g., blurred vision, chest pain, difficulty breathing, headache, weakness)     Denies    [id number] [place], [place] - [address] [place] [address] Phone: [phone]  Fax: [phone]  Protocols used: Blood Pressure - High-A-AH, Medication Refill and Renewal Call-A-AH
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
## 2023-11-25 ENCOUNTER — Telehealth: Payer: Self-pay | Admitting: Sports Medicine

## 2023-11-25 NOTE — Telephone Encounter (Signed)
 Patient called stating that she had an epidural on 11/4. Starting yesterday, she noticed she was walking differently because of her right leg. She said that it feels like it is on fire and her foot feels like a pin cushion. She is scheduled for another epidural on 11/25. She would like to know if these symptoms are normal or if there could be something else going on?  Please advise.

## 2023-11-25 NOTE — Telephone Encounter (Signed)
 Sent patient message via mychart so nothing gets lost in translation

## 2023-11-26 ENCOUNTER — Encounter: Payer: Self-pay | Admitting: Sports Medicine

## 2023-11-29 NOTE — Discharge Instructions (Signed)

## 2023-11-30 ENCOUNTER — Ambulatory Visit
Admission: RE | Admit: 2023-11-30 | Discharge: 2023-11-30 | Disposition: A | Discharge status: Home / Self Care | Source: Ambulatory Visit | Attending: Sports Medicine | Admitting: Sports Medicine

## 2023-11-30 ENCOUNTER — Other Ambulatory Visit: Payer: Self-pay | Admitting: Sports Medicine

## 2023-11-30 DIAGNOSIS — M51362 Other intervertebral disc degeneration, lumbar region with discogenic back pain and lower extremity pain: Secondary | ICD-10-CM

## 2023-11-30 DIAGNOSIS — G8929 Other chronic pain: Secondary | ICD-10-CM

## 2023-11-30 MED ORDER — METHYLPREDNISOLONE ACETATE 40 MG/ML INJ SUSP (RADIOLOG
80.0000 mg | Freq: Once | INTRAMUSCULAR | Status: AC
  Administered 2023-11-30: 80 mg via EPIDURAL

## 2023-11-30 MED ORDER — IOPAMIDOL (ISOVUE-M 200) INJECTION 41%
1.0000 mL | Freq: Once | INTRAMUSCULAR | Status: AC
  Administered 2023-11-30: 1 mL via EPIDURAL

## 2023-12-01 ENCOUNTER — Telehealth: Payer: Self-pay | Admitting: Sports Medicine

## 2023-12-01 ENCOUNTER — Other Ambulatory Visit: Payer: Self-pay | Admitting: Sports Medicine

## 2023-12-01 MED ORDER — GABAPENTIN 100 MG PO CAPS: 100.0000 mg | ORAL_CAPSULE | Freq: Every evening | ORAL | 0 refills | Status: DC | PRN

## 2023-12-01 NOTE — Telephone Encounter (Signed)
 Pt called, had epidural yesterday. Was talking to the radiologist and he recommended gabapentin  for her nerve pain. Her feet still feel like they have needle pain.  She had tried muscle relaxers but has stopped taking them, is interested in the gabapentin  if Dr. Leonce agrees.   [facility]

## 2023-12-01 NOTE — Progress Notes (Signed)
 Order placed

## 2023-12-01 NOTE — Telephone Encounter (Signed)
 Pt notified via phone.

## 2023-12-01 NOTE — Telephone Encounter (Signed)
 Gabapentin  placed patients pharmacy

## 2023-12-09 ENCOUNTER — Other Ambulatory Visit: Payer: Self-pay | Admitting: Internal Medicine

## 2023-12-13 NOTE — Progress Notes (Unsigned)
 Ben Jackson D.[facility] [address] [place] [address] Phone: [phone]   Assessment and Plan:     1. Chronic bilateral low back pain with right-sided sciatica (Primary) 2. Degeneration of intervertebral disc of lumbar region with discogenic back pain and lower extremity pain -Chronic with exacerbation, subsequent visit - Overall significant improvement in low back pain, >80%, with increased hypersensitivity to right lower extremity, likely consistent with regenerative hyperintense nerve sensation with improving impingement from epidural - Continue HEP and start physical therapy with goal of improving strength of right lower extremity - Continue gabapentin  100 mg twice daily.  May increase by additional 100 mg every 2 to 3 days until patient finds lowest effective dose.  Recommend maximum gabapentin  600 mg twice daily.    Pertinent previous records reviewed include epidural procedure note 11/30/23   Follow Up: 6 to 8 weeks for reevaluation.  If hypersensitivity is decreasing, strength is increasing, would continue with conservative treatment plan.  If no improvement or worsening strength or pain, would consider repeat epidural versus neurosurgery referral   Subjective:   I, Dominique Stevens, am serving as a neurosurgeon for Doctor Morene Mace   Chief Complaint: right shoulder and leg pain    HPI:    10/14/2023 Patient is a 48 year old female with right shoulder and leg pain. Patient states right shoulder pain 6 months ago. Decreased ROM. States she had a fall 3 weeks ago. Pain radiates up the neck and down to the elbow. Tylenol  and ibu for the pain does not help. Decreased grip strength. States she had a stroke. PCP says no stroke.   Left leg goes out on her 6 months ago. Pain has gotten worse. She is in a wheelchair. Quad pain that radiates down to her ankle. Decreased ROM. Notes she has numbness and tingling. Does also use a  one prong  cane    State she is supposed to have low back checked for arthritis. PCP told her she would need a shot in her back    11/15/2023 Patient states she is not in wheelchair. Numbness as gone away. She is using a 1 prong cane. Does endorse right hip pain. Notes she is nauseous everyday since epidural.  40% improvement   12/14/2023 Patient states she has some nerve pain. States she is having balance issues. Pain has decreased.  Wants to discuss gabapentin .  right leg  burning sensation.     Relevant Historical Information: Hypertension, GERD  Additional pertinent review of systems negative.   Current Outpatient Medications:    gabapentin  (NEURONTIN ) 100 MG capsule, Take 1 capsule (100 mg total) by mouth 2 (two) times daily., Disp: 90 capsule, Rfl: 0   albuterol  (VENTOLIN  HFA) 108 (90 Base) MCG/ACT inhaler, INHALE 2 PUFFS INTO THE LUNGS EVERY 4 HOURS AS NEEDED FOR WHEEZING OR SHORTNESS OF BREATH, Disp: 6.7 g, Rfl: 0   ALPRAZolam  (XANAX ) 1 MG tablet, Take 1 tablet (1 mg total) by mouth 3 (three) times daily as needed for anxiety., Disp: 90 tablet, Rfl: 2   baclofen  (LIORESAL ) 20 MG tablet, TAKE 1 TABLET BY MOUTH THREE TIMES A DAY, Disp: 90 tablet, Rfl: 0   celecoxib  (CELEBREX ) 200 MG capsule, TAKE 1 CAPSULE (200 MG TOTAL) BY MOUTH DAILY AS NEEDED., Disp: 30 capsule, Rfl: 0   esomeprazole  (NEXIUM ) 40 MG capsule, TAKE 1 CAPSULE (40 MG TOTAL) BY MOUTH DAILY AT 12 NOON., Disp: 30 capsule, Rfl: 5   Estradiol -Norethindrone  Acet 0.5-0.1 MG  tablet, TAKE 1 TABLET BY MOUTH EVERY DAY, Disp: 28 tablet, Rfl: 1   gabapentin  (NEURONTIN ) 100 MG capsule, Take 1 capsule (100 mg total) by mouth at bedtime as needed., Disp: 30 capsule, Rfl: 0   lidocaine  (LIDODERM ) 5 %, PLACE 1 PATCH ONTO THE SKIN DAILY. REMOVE & DISCARD PATCH WITHIN 12 HOURS OR AS DIRECTED BY MD, Disp: 30 patch, Rfl: 0   Loratadine (CLARITIN PO), Take by mouth., Disp: , Rfl:    losartan  (COZAAR ) 50 MG tablet, TAKE 1 TABLET BY MOUTH EVERY DAY, Disp:  30 tablet, Rfl: 2   magnesium  oxide (MAG-OX) 400 (240 Mg) MG tablet, Take 1 tablet (400 mg total) by mouth daily., Disp: 90 tablet, Rfl: 3   nystatin  cream (MYCOSTATIN ), APPLY TO AFFECTED AREA TWICE A DAY, Disp: 90 g, Rfl: 0   ondansetron  (ZOFRAN ) 4 MG tablet, Take 1 tablet (4 mg total) by mouth every 8 (eight) hours as needed for nausea or vomiting., Disp: 20 tablet, Rfl: 0   PARoxetine  (PAXIL ) 20 MG tablet, Take 1 tablet (20 mg total) by mouth daily., Disp: 90 tablet, Rfl: 1   promethazine  (PHENERGAN ) 25 MG tablet, Take 1 tablet (25 mg total) by mouth every 8 (eight) hours as needed for nausea or vomiting., Disp: 20 tablet, Rfl: 0   propranolol  ER (INDERAL  LA) 60 MG 24 hr capsule, TAKE 2 CAPSULES BY MOUTH DAILY, Disp: 180 capsule, Rfl: 1   Objective:     Vitals:   12/14/23 0948  Pulse: 89  SpO2: 98%  Weight: 182 lb (82.6 kg)  Height: 5' 6 (1.676 m)      Body mass index is 29.38 kg/m.    Physical Exam:    Gen: Appears well, nad, nontoxic and pleasant Psych: Alert and oriented, appropriate mood and affect Neuro: Hypersensitivity with light touch to right lower extremity compared to left.  Sensation intact, strength is 4/5 in right lower extremity, 5/5 in left lower extremity. Skin: no susupicious lesions or rashes   Back - Normal skin, Spine with normal alignment and no deformity.     tenderness to vertebral process palpation.   Paraspinous muscles are   tender and without spasm  TTP gluteal musculature Straight leg raise positive right Piriformis Test negative Gait antalgic, using 1 pronged cane    Electronically signed by:  Odis Mace D.[facility] 11:03 AM 12/14/23

## 2023-12-14 ENCOUNTER — Ambulatory Visit: Admitting: Sports Medicine

## 2023-12-14 VITALS — HR 89 | Ht 66.0 in | Wt 182.0 lb

## 2023-12-14 DIAGNOSIS — M51362 Other intervertebral disc degeneration, lumbar region with discogenic back pain and lower extremity pain: Secondary | ICD-10-CM

## 2023-12-14 DIAGNOSIS — G8929 Other chronic pain: Secondary | ICD-10-CM

## 2023-12-14 MED ORDER — GABAPENTIN 100 MG PO CAPS: 100.0000 mg | ORAL_CAPSULE | Freq: Two times a day (BID) | ORAL | 0 refills | Status: DC

## 2023-12-14 NOTE — Patient Instructions (Addendum)
 Start gabapentin  100 mg 2x a day . May increase by additional 100 mg every 2-3 days. Until you find a dose that is helpful and does not have negative side effects. Maximum dose 600 mg 2x daily   PT referral   6-8 week follow up   Call and let us  know when you find your dos and need a refill

## 2023-12-19 ENCOUNTER — Other Ambulatory Visit: Payer: Self-pay | Admitting: Internal Medicine

## 2023-12-20 ENCOUNTER — Other Ambulatory Visit (HOSPITAL_BASED_OUTPATIENT_CLINIC_OR_DEPARTMENT_OTHER): Payer: Self-pay | Admitting: Family Medicine

## 2023-12-20 ENCOUNTER — Telehealth: Payer: Self-pay | Admitting: Sports Medicine

## 2023-12-20 ENCOUNTER — Other Ambulatory Visit: Payer: Self-pay | Admitting: Sports Medicine

## 2023-12-20 DIAGNOSIS — G8929 Other chronic pain: Secondary | ICD-10-CM

## 2023-12-20 DIAGNOSIS — M51362 Other intervertebral disc degeneration, lumbar region with discogenic back pain and lower extremity pain: Secondary | ICD-10-CM

## 2023-12-20 DIAGNOSIS — M542 Cervicalgia: Secondary | ICD-10-CM

## 2023-12-20 DIAGNOSIS — I6783 Posterior reversible encephalopathy syndrome: Secondary | ICD-10-CM

## 2023-12-20 MED ORDER — CELECOXIB 200 MG PO CAPS: 200.0000 mg | ORAL_CAPSULE | Freq: Every day | ORAL | 0 refills | Status: DC | PRN

## 2023-12-20 MED ORDER — GABAPENTIN 300 MG PO CAPS: 300.0000 mg | ORAL_CAPSULE | Freq: Two times a day (BID) | ORAL | 2 refills | Status: DC

## 2023-12-20 NOTE — Telephone Encounter (Signed)
 Patient called and states that where she was referred to for Physical Therapy could not get her in until Jan 13th and she didn't know if Dr. Leonce wanted her to wait that long. Please advise.

## 2023-12-20 NOTE — Progress Notes (Signed)
 New referral placed.

## 2023-12-20 NOTE — Telephone Encounter (Signed)
 Patient called stating that Dr Leonce increased her Gabapentin  to 300mg  twice a day and asked if he would be able to send in the larger dose so she is not taking as many at one time.   She also needs a refill on Celebrex  200mg .  [facility] [id number] [facility] [place]

## 2023-12-20 NOTE — Telephone Encounter (Signed)
 Copied from CRM [id number]. Topic: Clinical - Medication Refill >> Dec 20, 2023 10:28 AM Shereese L wrote: Medication:  losartan  (COZAAR ) 50 MG tablet  PARoxetine  (PAXIL ) 20 MG tablet  Has the patient contacted their pharmacy? Yes (Agent: If no, request that the patient contact the pharmacy for the refill. If patient does not wish to contact the pharmacy document the reason why and proceed with request.) (Agent: If yes, when and what did the pharmacy advise?)  This is the patient's preferred pharmacy:  [id number] [place] [place] - [address] AT [facility] [address] [place] [address] Phone: [phone] Fax: [phone]   Is this the correct pharmacy for this prescription? Yes If no, delete pharmacy and type the correct one.   Has the prescription been filled recently? Yes  Is the patient out of the medication? Yes  Has the patient been seen for an appointment in the last year OR does the patient have an upcoming appointment? Yes  Can we respond through MyChart? Yes  Agent: Please be advised that Rx refills may take up to 3 business days. We ask that you follow-up with your pharmacy.

## 2023-12-22 NOTE — Telephone Encounter (Signed)
 It looks like patient may have been referred to the same PT clinic as before. Can we do somewhere else per phone note?

## 2023-12-27 ENCOUNTER — Other Ambulatory Visit (HOSPITAL_BASED_OUTPATIENT_CLINIC_OR_DEPARTMENT_OTHER): Payer: Self-pay

## 2023-12-27 ENCOUNTER — Encounter (HOSPITAL_BASED_OUTPATIENT_CLINIC_OR_DEPARTMENT_OTHER): Payer: Self-pay | Admitting: Family Medicine

## 2023-12-27 ENCOUNTER — Ambulatory Visit (HOSPITAL_BASED_OUTPATIENT_CLINIC_OR_DEPARTMENT_OTHER): Admitting: Family Medicine

## 2023-12-27 VITALS — BP 145/109 | HR 76 | Temp 98.3°F | Resp 16 | Ht 65.75 in | Wt 190.0 lb

## 2023-12-27 DIAGNOSIS — M545 Low back pain, unspecified: Secondary | ICD-10-CM

## 2023-12-27 DIAGNOSIS — F419 Anxiety disorder, unspecified: Secondary | ICD-10-CM | POA: Diagnosis not present

## 2023-12-27 DIAGNOSIS — G43909 Migraine, unspecified, not intractable, without status migrainosus: Secondary | ICD-10-CM

## 2023-12-27 DIAGNOSIS — Z720 Tobacco use: Secondary | ICD-10-CM | POA: Diagnosis not present

## 2023-12-27 DIAGNOSIS — K219 Gastro-esophageal reflux disease without esophagitis: Secondary | ICD-10-CM

## 2023-12-27 DIAGNOSIS — I10 Essential (primary) hypertension: Secondary | ICD-10-CM | POA: Diagnosis not present

## 2023-12-27 DIAGNOSIS — G8929 Other chronic pain: Secondary | ICD-10-CM

## 2023-12-27 DIAGNOSIS — R931 Abnormal findings on diagnostic imaging of heart and coronary circulation: Secondary | ICD-10-CM | POA: Insufficient documentation

## 2023-12-27 MED ORDER — GABAPENTIN 300 MG PO CAPS
300.0000 mg | ORAL_CAPSULE | Freq: Two times a day (BID) | ORAL | 2 refills | Status: AC
  Filled 2023-12-27 – 2024-01-24 (×3): qty 60, 30d supply, fill #0

## 2023-12-27 MED ORDER — ESOMEPRAZOLE MAGNESIUM 40 MG PO CPDR
40.0000 mg | DELAYED_RELEASE_CAPSULE | Freq: Every day | ORAL | 1 refills | Status: AC
  Filled 2023-12-27 – 2024-01-03 (×3): qty 90, 90d supply, fill #0

## 2023-12-27 MED ORDER — DULOXETINE HCL 30 MG PO CPEP
30.0000 mg | ORAL_CAPSULE | Freq: Every day | ORAL | 1 refills | Status: AC
  Filled 2023-12-27: qty 30, 30d supply, fill #0
  Filled 2024-01-24: qty 30, 30d supply, fill #1

## 2023-12-27 MED ORDER — LOSARTAN POTASSIUM 100 MG PO TABS
100.0000 mg | ORAL_TABLET | Freq: Every day | ORAL | 1 refills | Status: AC
  Filled 2023-12-27: qty 30, 30d supply, fill #0

## 2023-12-27 MED ORDER — ALPRAZOLAM 1 MG PO TABS
1.0000 mg | ORAL_TABLET | Freq: Two times a day (BID) | ORAL | 0 refills | Status: AC | PRN
  Filled 2023-12-27 – 2024-01-24 (×5): qty 60, 30d supply, fill #0

## 2023-12-27 MED ORDER — CELECOXIB 200 MG PO CAPS
200.0000 mg | ORAL_CAPSULE | Freq: Every day | ORAL | 1 refills | Status: AC | PRN
  Filled 2023-12-27 – 2024-01-26 (×4): qty 30, 30d supply, fill #0

## 2023-12-27 MED ORDER — PROPRANOLOL HCL ER 60 MG PO CP24
120.0000 mg | ORAL_CAPSULE | Freq: Every day | ORAL | 0 refills | Status: AC
  Filled 2023-12-27 – 2024-01-03 (×3): qty 180, 90d supply, fill #0

## 2023-12-27 NOTE — Assessment & Plan Note (Deleted)
" °  Orders:   ALPRAZolam  (XANAX ) 1 MG tablet; Take 1 tablet (1 mg total) by mouth 2 (two) times daily as needed for anxiety.  "

## 2023-12-27 NOTE — Assessment & Plan Note (Addendum)
 DASH diet.  Cozaar  increased and monitor carefully at home.  Promptly obtain somewhat recent [facility] records. Orders:   losartan  (COZAAR ) 100 MG tablet; Take 1 tablet (100 mg total) by mouth daily.

## 2023-12-27 NOTE — Progress Notes (Signed)
 "  New Patient Office Visit  Subjective    Patient ID: Dominique Stevens, female    DOB: 08-Dec-1975  Age: 48 y.o. MRN: [id number]  CC:  Chief Complaint  Patient presents with   Establish Care    Establish Care     Discussed the use of AI scribe software for clinical note transcription with the patient, who gave verbal consent to proceed.  History of Present Illness Dominique Stevens is a 48 year old female with hypertension and anxiety who presents with back pain and medication management issues.  She has experienced a significant increase in blood pressure, reaching levels in the 200s.  Later hospitalized at [facility] for a complicated Migraine.  She has not been on blood pressure medication for four days due to her previous doctor not refilling her prescription, resulting in elevated blood pressure again.  She is experiencing severe back pain, which she reports has been discussed as possibly related to a herniated disc and arthritis in her lower back by her sports medicine doctor. The pain has been treated with epidural injections by a sports medicine doctor, which she reports provided temporary relief. However, the pain has returned, affecting her mobility and daily activities, such as wrapping Christmas gifts. She is currently taking gabapentin  and Celebrex , but she reports that these medications are not helping with her pain.  Her anxiety has been exacerbated by her current situation, including the stress of caring for her husband who has a traumatic brain injury. She has not taken her Paxil  for four days, contributing to increased anxiety.  She has a history of kidney stones, acid reflux, etc.  She is in menopause and was previously prescribed hormones. She has a history of a benign breast lump.  Socially, she has been a smoker since age 83, except during her pregnancies, and currently smokes a pack a day. 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 She does not consume alcohol or use recreational drugs.    Outpatient Encounter Medications as of 12/27/2023  Medication Sig   albuterol  (VENTOLIN  HFA) 108 (90 Base) MCG/ACT inhaler INHALE 2 PUFFS INTO THE LUNGS EVERY 4 HOURS AS NEEDED FOR WHEEZING OR SHORTNESS OF BREATH   DULoxetine  (CYMBALTA ) 30 MG capsule Take 1 capsule (30 mg total) by mouth daily.   Estradiol -Norethindrone  Acet 0.5-0.1 MG tablet TAKE 1 TABLET BY MOUTH EVERY DAY   Loratadine (CLARITIN PO) Take by mouth.   nystatin  cream (MYCOSTATIN ) APPLY TO AFFECTED AREA TWICE A DAY   [DISCONTINUED] ALPRAZolam  (XANAX ) 1 MG tablet Take 1 tablet (1 mg total) by mouth 3 (three) times daily as needed for anxiety.   [DISCONTINUED] celecoxib  (CELEBREX ) 200 MG capsule Take 1 capsule (200 mg total) by mouth daily as needed.   [DISCONTINUED] esomeprazole  (NEXIUM ) 40 MG capsule TAKE 1 CAPSULE (40 MG TOTAL) BY MOUTH DAILY AT 12 NOON.   [DISCONTINUED] gabapentin  (NEURONTIN ) 300 MG capsule Take 1 capsule (300 mg total) by mouth 2 (two) times daily.   [DISCONTINUED] losartan  (COZAAR ) 50 MG tablet TAKE 1 TABLET BY MOUTH EVERY DAY   [DISCONTINUED] PARoxetine  (PAXIL ) 20 MG tablet Take 1 tablet (20 mg total) by mouth daily.   [DISCONTINUED] promethazine  (PHENERGAN ) 25 MG tablet Take 1 tablet (25 mg total) by mouth every 8 (eight) hours as needed for nausea or vomiting.   [DISCONTINUED] propranolol  ER (INDERAL  LA) 60 MG 24 hr capsule TAKE 2 CAPSULES BY MOUTH DAILY  ALPRAZolam  (XANAX ) 1 MG tablet Take 1 tablet (1 mg total) by mouth 2 (two) times daily as needed for anxiety.   celecoxib  (CELEBREX ) 200 MG capsule Take 1 capsule (200 mg total) by mouth daily as needed.   esomeprazole  (NEXIUM ) 40 MG capsule Take 1 capsule (40 mg total) by mouth daily at 12 noon.   gabapentin  (NEURONTIN ) 300 MG capsule Take 1 capsule (300 mg total) by mouth 2 (two) times daily.   losartan  (COZAAR ) 100 MG tablet Take 1 tablet (100 mg total) by mouth  daily.   propranolol  ER (INDERAL  LA) 60 MG 24 hr capsule Take 2 capsules (120 mg total) by mouth daily.   [DISCONTINUED] baclofen  (LIORESAL ) 20 MG tablet TAKE 1 TABLET BY MOUTH THREE TIMES A DAY   [DISCONTINUED] gabapentin  (NEURONTIN ) 100 MG capsule Take 1 capsule (100 mg total) by mouth at bedtime as needed.   [DISCONTINUED] lidocaine  (LIDODERM ) 5 % PLACE 1 PATCH ONTO THE SKIN DAILY. REMOVE & DISCARD PATCH WITHIN 12 HOURS OR AS DIRECTED BY MD   [DISCONTINUED] magnesium  oxide (MAG-OX) 400 (240 Mg) MG tablet Take 1 tablet (400 mg total) by mouth daily.   [DISCONTINUED] ondansetron  (ZOFRAN ) 4 MG tablet Take 1 tablet (4 mg total) by mouth every 8 (eight) hours as needed for nausea or vomiting.   No facility-administered encounter medications on file as of 12/27/2023.    Past Medical History:  Diagnosis Date   Anxiety    Chronic back pain    known to [facility]   Colonoscopy refused    GERD (gastroesophageal reflux disease)    Hepatic steatosis    Hypertension    Kidney stones    Migraine    Tobacco abuse    30 pack year history    Past Surgical History:  Procedure Laterality Date   ABLATION     BREAST BIOPSY Right 10/21/2021   BREAST BIOPSY  12/04/2021   MM RT RADIOACTIVE SEED LOC MAMMO GUIDE 12/04/2021 [facility] MAMMOGRAP[facility]   BREAST LUMPECTOMY WITH RADIOACTIVE SEED LOCALIZATION Right 12/05/2021   Procedure: RIGHT BREAST LUMPECTOMY WITH RADIOACTIVE SEED LOCALIZATION;  Surgeon: Curvin Deward MOULD, MD;  Location: [facility];  Service: General;  Laterality: Right;   CHOLECYSTECTOMY     TUBAL LIGATION      Family History  Problem Relation Age of Onset   Hypertension Mother    Varicose Veins Mother    Hypertension Maternal Grandmother    Heart disease Maternal Grandfather    Anxiety disorder Father    Depression Father    Diabetes Father    ADD / ADHD Brother    Anxiety disorder Brother    ADD / ADHD Brother    Anxiety disorder Brother    Breast cancer  Neg Hx     Social History[1]  Review of Systems  Constitutional:  Negative for diaphoresis, fever, malaise/fatigue and weight loss.  Respiratory:  Negative for cough, shortness of breath and wheezing.   Cardiovascular:  Negative for chest pain, palpitations, orthopnea, claudication, leg swelling and PND.  Psychiatric/Behavioral:  Positive for depression. Negative for hallucinations, substance abuse and suicidal ideas. The patient is nervous/anxious.         Objective    BP (!) 145/109   Pulse 76   Temp 98.3 F (36.8 C) (Oral)   Resp 16   Ht 5' 5.75 (1.67 m)   Wt 190 lb (86.2 kg)   SpO2 94%   BMI 30.90 kg/m   Physical Exam Constitutional:  General: She is not in acute distress.    Appearance: Normal appearance.     Comments: Overweight.  Stiff gait with cane used.  HENT:     Head: Normocephalic.  Cardiovascular:     Rate and Rhythm: Normal rate and regular rhythm.     Pulses: Normal pulses.     Heart sounds: Normal heart sounds.  Pulmonary:     Effort: Pulmonary effort is normal.     Breath sounds: Normal breath sounds.  Abdominal:     General: Bowel sounds are normal.     Palpations: Abdomen is soft.  Musculoskeletal:     Cervical back: Neck supple. No tenderness.     Right lower leg: No edema.     Left lower leg: No edema.     Comments: Mild to mod right lower back pain.  Positive SLR on the right.  Neurological:     Mental Status: She is alert.     Comments: Mild RLE weakness.  Otherwise nonfocal exam.         Assessment & Plan:   Assessment & Plan Chronic bilateral low back pain without sciatica Chronic low back pain due to lumbar disc displacement, resistant to current treatment including epidural injections and gabapentin . She is not interested in surgical intervention and is experiencing significant functional impairment, including difficulty moving and performing daily activities. - Referred to [facility] for further evaluation and management of  chronic low back pain. - Requested records from Dr. Cloretta for continuity of care. Orders:   Ambulatory referral to Orthopedic Surgery   DULoxetine  (CYMBALTA ) 30 MG capsule; Take 1 capsule (30 mg total) by mouth daily.   gabapentin  (NEURONTIN ) 300 MG capsule; Take 1 capsule (300 mg total) by mouth 2 (two) times daily.   celecoxib  (CELEBREX ) 200 MG capsule; Take 1 capsule (200 mg total) by mouth daily as needed.  Primary hypertension DASH diet.  Cozaar  increased and monitor carefully at home.  Promptly obtain somewhat recent [facility] records. Orders:   losartan  (COZAAR ) 100 MG tablet; Take 1 tablet (100 mg total) by mouth daily.  Anxiety Trial of Duloxetine .  Agreed to Xanax  refill for now, but somewhat extended discussion about this risks of this medication today.  Eventually she'll have to agree to a weaning of this or agree to a Psych referral. Orders:   ALPRAZolam  (XANAX ) 1 MG tablet; Take 1 tablet (1 mg total) by mouth 2 (two) times daily as needed for anxiety.  Abnormal echocardiogram Eventually obtain additional imaging of her Aorta per [facility] records.    Gastroesophageal reflux disease, unspecified whether esophagitis present Dietary precautions. Orders:   esomeprazole  (NEXIUM ) 40 MG capsule; Take 1 capsule (40 mg total) by mouth daily at 12 noon.  Migraine without status migrainosus, not intractable, unspecified migraine type Stable. Orders:   propranolol  ER (INDERAL  LA) 60 MG 24 hr capsule; Take 2 capsules (120 mg total) by mouth daily.  Tobacco abuse Obviously urged to stop.  Her interest is limited.  Address further in the future.          Return in about 4 weeks (around 01/24/2024) for chronic follow-up.   REDDING PONCE NORLEEN FALCON., MD     [1]  Social History Tobacco Use   Smoking status: Every Day    Current packs/day: 0.50    Average packs/day: 0.7 packs/day for 30.0 years (20.0 ttl pk-yrs)    Types: Cigarettes   Smokeless tobacco: Current  Vaping Use   Vaping  status: Never Used  Substance Use Topics  Alcohol use: Not Currently   Drug use: No   "

## 2023-12-27 NOTE — Assessment & Plan Note (Deleted)
 Orders:    losartan  (COZAAR ) 100 MG tablet; Take 1 tablet (100 mg total) by mouth daily.

## 2023-12-27 NOTE — Assessment & Plan Note (Addendum)
 Obviously urged to stop.  Her interest is limited.  Address further in the future.

## 2023-12-27 NOTE — Assessment & Plan Note (Addendum)
 Eventually obtain additional imaging of her Aorta per [facility] records.

## 2023-12-27 NOTE — Assessment & Plan Note (Addendum)
 Dietary precautions. Orders:   esomeprazole  (NEXIUM ) 40 MG capsule; Take 1 capsule (40 mg total) by mouth daily at 12 noon.

## 2023-12-27 NOTE — Assessment & Plan Note (Addendum)
 Trial of Duloxetine .  Agreed to Xanax  refill for now, but somewhat extended discussion about this risks of this medication today.  Eventually she'll have to agree to a weaning of this or agree to a Psych referral. Orders:   ALPRAZolam  (XANAX ) 1 MG tablet; Take 1 tablet (1 mg total) by mouth 2 (two) times daily as needed for anxiety.

## 2023-12-28 ENCOUNTER — Other Ambulatory Visit (HOSPITAL_BASED_OUTPATIENT_CLINIC_OR_DEPARTMENT_OTHER): Payer: Self-pay | Admitting: Family Medicine

## 2023-12-28 ENCOUNTER — Encounter (HOSPITAL_BASED_OUTPATIENT_CLINIC_OR_DEPARTMENT_OTHER): Payer: Self-pay | Admitting: Family Medicine

## 2023-12-28 MED ORDER — ESTRADIOL-NORETHINDRONE ACET 0.5-0.1 MG PO TABS: 1.0000 | ORAL_TABLET | Freq: Every day | ORAL | 0 refills | Status: AC

## 2024-01-03 ENCOUNTER — Other Ambulatory Visit (HOSPITAL_BASED_OUTPATIENT_CLINIC_OR_DEPARTMENT_OTHER): Payer: Self-pay

## 2024-01-06 ENCOUNTER — Other Ambulatory Visit: Payer: Self-pay | Admitting: Family Medicine

## 2024-01-24 ENCOUNTER — Other Ambulatory Visit: Payer: Self-pay | Admitting: Internal Medicine

## 2024-01-24 ENCOUNTER — Other Ambulatory Visit (HOSPITAL_BASED_OUTPATIENT_CLINIC_OR_DEPARTMENT_OTHER): Payer: Self-pay

## 2024-01-25 ENCOUNTER — Ambulatory Visit (HOSPITAL_BASED_OUTPATIENT_CLINIC_OR_DEPARTMENT_OTHER): Admitting: Family Medicine

## 2024-01-25 ENCOUNTER — Other Ambulatory Visit: Payer: Self-pay | Admitting: Sports Medicine

## 2024-01-25 DIAGNOSIS — G8929 Other chronic pain: Secondary | ICD-10-CM

## 2024-01-26 ENCOUNTER — Other Ambulatory Visit (HOSPITAL_BASED_OUTPATIENT_CLINIC_OR_DEPARTMENT_OTHER): Payer: Self-pay

## 2024-01-27 ENCOUNTER — Other Ambulatory Visit (HOSPITAL_BASED_OUTPATIENT_CLINIC_OR_DEPARTMENT_OTHER): Payer: Self-pay

## 2024-01-27 ENCOUNTER — Other Ambulatory Visit: Payer: Self-pay | Admitting: Sports Medicine

## 2024-01-27 ENCOUNTER — Encounter: Payer: Self-pay | Admitting: Sports Medicine

## 2024-01-27 MED ORDER — LIDOCAINE 5 % EX PTCH: 1.0000 | MEDICATED_PATCH | CUTANEOUS | 0 refills | Status: AC

## 2024-01-27 NOTE — Progress Notes (Signed)
 Refill placed

## 2024-01-27 NOTE — Telephone Encounter (Signed)
 Refill placed

## 2024-02-04 ENCOUNTER — Other Ambulatory Visit (HOSPITAL_BASED_OUTPATIENT_CLINIC_OR_DEPARTMENT_OTHER): Payer: Self-pay
